# Patient Record
Sex: Female | Born: 1965
Health system: Southern US, Community
[De-identification: ages and names within clinical notes are randomized; demographics above are authoritative.]

## PROBLEM LIST (undated history)

## (undated) DIAGNOSIS — E785 Hyperlipidemia, unspecified: Secondary | ICD-10-CM

## (undated) DIAGNOSIS — E119 Type 2 diabetes mellitus without complications: Secondary | ICD-10-CM

## (undated) DIAGNOSIS — D649 Anemia, unspecified: Secondary | ICD-10-CM

## (undated) DIAGNOSIS — G35 Multiple sclerosis: Secondary | ICD-10-CM

## (undated) DIAGNOSIS — A6000 Herpesviral infection of urogenital system, unspecified: Secondary | ICD-10-CM

## (undated) DIAGNOSIS — H469 Unspecified optic neuritis: Secondary | ICD-10-CM

## (undated) DIAGNOSIS — M1990 Unspecified osteoarthritis, unspecified site: Secondary | ICD-10-CM

## (undated) DIAGNOSIS — I10 Essential (primary) hypertension: Secondary | ICD-10-CM

## (undated) HISTORY — PX: OTHER SURGICAL HISTORY: SHX169

## (undated) HISTORY — PX: BREAST BIOPSY: SHX20

## (undated) HISTORY — PX: ENDOMETRIAL ABLATION: SHX621

## (undated) HISTORY — PX: LAPAROSCOPIC TUBAL LIGATION: SUR803

---

## 1999-09-30 ENCOUNTER — Ambulatory Visit (HOSPITAL_COMMUNITY): Admission: RE | Admit: 1999-09-30 | Discharge: 1999-09-30 | Payer: Self-pay | Admitting: Obstetrics & Gynecology

## 1999-09-30 ENCOUNTER — Encounter: Payer: Self-pay | Admitting: Obstetrics & Gynecology

## 1999-12-27 ENCOUNTER — Encounter: Payer: Self-pay | Admitting: Endocrinology

## 1999-12-27 ENCOUNTER — Ambulatory Visit (HOSPITAL_COMMUNITY): Admission: RE | Admit: 1999-12-27 | Discharge: 1999-12-27 | Payer: Self-pay | Admitting: Endocrinology

## 2000-01-18 ENCOUNTER — Encounter: Payer: Self-pay | Admitting: Neurology

## 2000-01-18 ENCOUNTER — Ambulatory Visit (HOSPITAL_COMMUNITY): Admission: RE | Admit: 2000-01-18 | Discharge: 2000-01-18 | Payer: Self-pay | Admitting: Neurology

## 2000-06-11 ENCOUNTER — Other Ambulatory Visit: Admission: RE | Admit: 2000-06-11 | Discharge: 2000-06-11 | Payer: Self-pay | Admitting: Obstetrics & Gynecology

## 2000-12-27 ENCOUNTER — Inpatient Hospital Stay (HOSPITAL_COMMUNITY): Admission: AD | Admit: 2000-12-27 | Discharge: 2000-12-27 | Payer: Self-pay | Admitting: Obstetrics & Gynecology

## 2000-12-30 ENCOUNTER — Inpatient Hospital Stay (HOSPITAL_COMMUNITY): Admission: AD | Admit: 2000-12-30 | Discharge: 2001-01-01 | Payer: Self-pay | Admitting: Obstetrics & Gynecology

## 2001-01-02 ENCOUNTER — Encounter: Admission: RE | Admit: 2001-01-02 | Discharge: 2001-02-01 | Payer: Self-pay | Admitting: Obstetrics & Gynecology

## 2001-03-04 ENCOUNTER — Encounter: Admission: RE | Admit: 2001-03-04 | Discharge: 2001-03-12 | Payer: Self-pay | Admitting: Obstetrics & Gynecology

## 2002-01-03 ENCOUNTER — Other Ambulatory Visit: Admission: RE | Admit: 2002-01-03 | Discharge: 2002-01-03 | Payer: Self-pay | Admitting: Obstetrics & Gynecology

## 2003-04-30 ENCOUNTER — Other Ambulatory Visit: Admission: RE | Admit: 2003-04-30 | Discharge: 2003-04-30 | Payer: Self-pay | Admitting: Obstetrics & Gynecology

## 2004-06-01 ENCOUNTER — Other Ambulatory Visit: Admission: RE | Admit: 2004-06-01 | Discharge: 2004-06-01 | Payer: Self-pay | Admitting: Obstetrics & Gynecology

## 2006-04-18 ENCOUNTER — Ambulatory Visit: Payer: Self-pay | Admitting: Internal Medicine

## 2007-05-02 ENCOUNTER — Ambulatory Visit: Payer: Self-pay | Admitting: Obstetrics and Gynecology

## 2008-07-02 ENCOUNTER — Ambulatory Visit: Payer: Self-pay | Admitting: Obstetrics and Gynecology

## 2009-08-04 ENCOUNTER — Ambulatory Visit: Payer: Self-pay | Admitting: Obstetrics and Gynecology

## 2009-08-12 ENCOUNTER — Ambulatory Visit: Payer: Self-pay | Admitting: Obstetrics and Gynecology

## 2010-09-01 ENCOUNTER — Ambulatory Visit: Payer: Self-pay | Admitting: Obstetrics and Gynecology

## 2010-09-26 ENCOUNTER — Ambulatory Visit: Payer: Self-pay | Admitting: Obstetrics and Gynecology

## 2010-10-30 DIAGNOSIS — D259 Leiomyoma of uterus, unspecified: Secondary | ICD-10-CM | POA: Insufficient documentation

## 2011-09-04 ENCOUNTER — Ambulatory Visit: Payer: Self-pay | Admitting: Obstetrics and Gynecology

## 2012-04-09 DIAGNOSIS — G35 Multiple sclerosis: Secondary | ICD-10-CM | POA: Insufficient documentation

## 2012-09-04 ENCOUNTER — Ambulatory Visit: Payer: Self-pay | Admitting: Obstetrics and Gynecology

## 2013-06-07 DIAGNOSIS — E7800 Pure hypercholesterolemia, unspecified: Secondary | ICD-10-CM | POA: Insufficient documentation

## 2013-09-09 ENCOUNTER — Ambulatory Visit: Payer: Self-pay | Admitting: Obstetrics and Gynecology

## 2013-09-09 DIAGNOSIS — M25559 Pain in unspecified hip: Secondary | ICD-10-CM | POA: Insufficient documentation

## 2013-09-23 ENCOUNTER — Ambulatory Visit: Payer: Self-pay | Admitting: Obstetrics and Gynecology

## 2014-04-10 DIAGNOSIS — R5383 Other fatigue: Secondary | ICD-10-CM | POA: Insufficient documentation

## 2014-04-10 DIAGNOSIS — R634 Abnormal weight loss: Secondary | ICD-10-CM | POA: Insufficient documentation

## 2014-09-08 DIAGNOSIS — F4329 Adjustment disorder with other symptoms: Secondary | ICD-10-CM | POA: Insufficient documentation

## 2014-09-19 ENCOUNTER — Emergency Department: Payer: Self-pay | Admitting: Emergency Medicine

## 2014-09-19 LAB — CBC WITH DIFFERENTIAL/PLATELET
Basophil #: 0.1 10*3/uL (ref 0.0–0.1)
Basophil %: 0.7 %
Eosinophil #: 0 10*3/uL (ref 0.0–0.7)
Eosinophil %: 0.1 %
HCT: 27.9 % — ABNORMAL LOW (ref 35.0–47.0)
HGB: 8.1 g/dL — ABNORMAL LOW (ref 12.0–16.0)
Lymphocyte #: 1 10*3/uL (ref 1.0–3.6)
Lymphocyte %: 5.9 %
MCH: 18.2 pg — ABNORMAL LOW (ref 26.0–34.0)
MCHC: 29.2 g/dL — ABNORMAL LOW (ref 32.0–36.0)
MCV: 63 fL — ABNORMAL LOW (ref 80–100)
Monocyte #: 0.8 x10 3/mm (ref 0.2–0.9)
Monocyte %: 4.6 %
Neutrophil #: 14.9 10*3/uL — ABNORMAL HIGH (ref 1.4–6.5)
Neutrophil %: 88.7 %
Platelet: 367 10*3/uL (ref 150–440)
RBC: 4.47 10*6/uL (ref 3.80–5.20)
RDW: 20.4 % — ABNORMAL HIGH (ref 11.5–14.5)
WBC: 16.8 10*3/uL — ABNORMAL HIGH (ref 3.6–11.0)

## 2014-09-19 LAB — COMPREHENSIVE METABOLIC PANEL
Albumin: 3.6 g/dL (ref 3.4–5.0)
Alkaline Phosphatase: 61 U/L
Anion Gap: 10 (ref 7–16)
BUN: 10 mg/dL (ref 7–18)
Bilirubin,Total: 0.5 mg/dL (ref 0.2–1.0)
Calcium, Total: 8.7 mg/dL (ref 8.5–10.1)
Chloride: 102 mmol/L (ref 98–107)
Co2: 23 mmol/L (ref 21–32)
Creatinine: 0.74 mg/dL (ref 0.60–1.30)
EGFR (African American): >60
EGFR (Non-African Amer.): >60
Glucose: 267 mg/dL — ABNORMAL HIGH (ref 65–99)
Osmolality: 279 (ref 275–301)
Potassium: 4.5 mmol/L (ref 3.5–5.1)
SGOT(AST): 19 U/L (ref 15–37)
SGPT (ALT): 12 U/L — ABNORMAL LOW
Sodium: 135 mmol/L — ABNORMAL LOW (ref 136–145)
Total Protein: 6.9 g/dL (ref 6.4–8.2)

## 2014-10-27 ENCOUNTER — Ambulatory Visit: Payer: Self-pay | Admitting: Obstetrics and Gynecology

## 2015-01-14 DIAGNOSIS — I152 Hypertension secondary to endocrine disorders: Secondary | ICD-10-CM | POA: Insufficient documentation

## 2015-01-14 DIAGNOSIS — E1159 Type 2 diabetes mellitus with other circulatory complications: Secondary | ICD-10-CM | POA: Insufficient documentation

## 2015-01-14 DIAGNOSIS — D500 Iron deficiency anemia secondary to blood loss (chronic): Secondary | ICD-10-CM | POA: Insufficient documentation

## 2015-12-02 DIAGNOSIS — F17200 Nicotine dependence, unspecified, uncomplicated: Secondary | ICD-10-CM | POA: Insufficient documentation

## 2016-08-18 ENCOUNTER — Encounter: Payer: Self-pay | Admitting: *Deleted

## 2016-08-18 ENCOUNTER — Emergency Department
Admission: EM | Admit: 2016-08-18 | Discharge: 2016-08-18 | Disposition: A | Discharge status: Home / Self Care | Payer: Self-pay | Attending: Emergency Medicine | Admitting: Emergency Medicine

## 2016-08-18 DIAGNOSIS — I10 Essential (primary) hypertension: Secondary | ICD-10-CM | POA: Insufficient documentation

## 2016-08-18 DIAGNOSIS — N939 Abnormal uterine and vaginal bleeding, unspecified: Secondary | ICD-10-CM

## 2016-08-18 DIAGNOSIS — F17210 Nicotine dependence, cigarettes, uncomplicated: Secondary | ICD-10-CM | POA: Insufficient documentation

## 2016-08-18 DIAGNOSIS — E119 Type 2 diabetes mellitus without complications: Secondary | ICD-10-CM | POA: Insufficient documentation

## 2016-08-18 HISTORY — DX: Type 2 diabetes mellitus without complications: E11.9

## 2016-08-18 HISTORY — DX: Essential (primary) hypertension: I10

## 2016-08-18 HISTORY — DX: Multiple sclerosis: G35

## 2016-08-18 LAB — CBC WITH DIFFERENTIAL/PLATELET
Basophils Absolute: 0.1 10*3/uL (ref 0–0.1)
Basophils Relative: 1 %
Eosinophils Absolute: 0 10*3/uL (ref 0–0.7)
Eosinophils Relative: 0 %
HCT: 33.1 % — ABNORMAL LOW (ref 35.0–47.0)
HEMOGLOBIN: 10.8 g/dL — AB (ref 12.0–16.0)
LYMPHS ABS: 1 10*3/uL (ref 1.0–3.6)
LYMPHS PCT: 15 %
MCH: 24.2 pg — AB (ref 26.0–34.0)
MCHC: 32.5 g/dL (ref 32.0–36.0)
MCV: 74.5 fL — AB (ref 80.0–100.0)
Monocytes Absolute: 0.6 10*3/uL (ref 0.2–0.9)
Monocytes Relative: 9 %
NEUTROS PCT: 75 %
Neutro Abs: 5.1 10*3/uL (ref 1.4–6.5)
Platelets: 305 10*3/uL (ref 150–440)
RBC: 4.45 MIL/uL (ref 3.80–5.20)
RDW: 18.2 % — ABNORMAL HIGH (ref 11.5–14.5)
WBC: 6.7 10*3/uL (ref 3.6–11.0)

## 2016-08-18 NOTE — ED Triage Notes (Signed)
Pt reports heavy vaginal bleeding with clots for 2 months, pt reports having no insurance and is unable to see OB/GYN

## 2016-08-18 NOTE — ED Notes (Signed)
Patient denies ABD/Pelvic Pain, Chest Pain, ShOB, N/V. Patient arrives to [facility] per the request of her PCP for evaluation of her "Blood Level".

## 2016-08-18 NOTE — Discharge Instructions (Signed)
Please follow up closely with obstetrics and gynecology or your primary doctor.  Follow-up with OBGYN (your doctor in [place] or our physician team).  Return to the emergency room if your bleeding worsens, you become weak and dizzy or lightheaded, you have an episode of passing out, develop severe bleeding such as more than 1 soaked pad per hour for more than 3 straight hours, develop abdominal or pelvic pain, fevers chills or other new concerns arise.

## 2016-08-18 NOTE — ED Provider Notes (Signed)
[facility] Emergency Department Provider Note   ____________________________________________   First MD Initiated Contact with Patient 08/18/16 [phone]     (approximate)  I have reviewed the triage vital signs and the nursing notes.   HISTORY  Chief Complaint Vaginal Bleeding    HPI Meghan Callahan is a 50 y.o. female here for evaluation of vaginal bleeding  Patient reports she has not any pain. Off-and-on for the last 3 months she's had intermittent vaginal bleeding, we'll bleed for a few days with clots, and then improve on its own. She is not taking blood thinners  No pain fevers or chills. Denies shortness of breath lightheadedness weakness or chest pain  She's been experiencing bleeding that goes through about 3 tampons a day for the last 2-3 days. This morning when she got up she had a large clot that passed, she is called her doctor but she is not able to follow up with Kendell Bane right now due to insurance problems.  She's had ultrasounds before and reports that she's been told she has abnormal uterine bleeding, that she has fibroids, she's had a previous endometrial ablation  Past Medical History:  Diagnosis Date  . Diabetes mellitus without complication (HCC)   . Hypertension   . MS (multiple sclerosis) (HCC)     There are no active problems to display for this patient.   No past surgical history on file.  Prior to Admission medications   Not on File  Ibuprofen as needed Uses insulin  Allergies Review of patient's allergies indicates no known allergies.  No family history on file.  Social History Social History  Substance Use Topics  . Smoking status: Current Every Day Smoker    Packs/day: 1.00    Types: Cigarettes  . Smokeless tobacco: Not on file  . Alcohol use No    Review of Systems Constitutional: No fever/chills Eyes: No visual changes. ENT: No sore throat. Cardiovascular: Denies chest  pain. Respiratory: Denies shortness of breath. Gastrointestinal: No abdominal pain.  No nausea, no vomiting.  No diarrhea.  No constipation. Genitourinary: Negative for dysuria. Musculoskeletal: Negative for back pain. Skin: Negative for rash. Neurological: Negative for headaches, focal weakness or numbness.  10-point ROS otherwise negative.  ____________________________________________   PHYSICAL EXAM:  VITAL SIGNS: ED Triage Vitals  Enc Vitals Group     BP 08/18/16 0825 126/84     Pulse Rate 08/18/16 0825 (!) 105     Resp 08/18/16 0825 18     Temp 08/18/16 0825 97.9 F (36.6 C)     Temp Source 08/18/16 0825 Oral     SpO2 08/18/16 0825 100 %     Weight 08/18/16 0815 138 lb (62.6 kg)     Height 08/18/16 0815 5\' 9"  (1.753 m)     Head Circumference --      Peak Flow --      Pain Score --      Pain Loc --      Pain Edu? --      Excl. in GC? --    Constitutional: Alert and oriented. Well appearing and in no acute distress. Eyes: Conjunctivae are normal. PERRL. EOMI. Head: Atraumatic. Nose: No congestion/rhinnorhea. Mouth/Throat: Mucous membranes are moist.  Oropharynx non-erythematous. Neck: No stridor.   Cardiovascular: Normal rate, regular rhythm. Grossly normal heart sounds.  Good peripheral circulation. Respiratory: Normal respiratory effort.  No retractions. Lungs CTAB. Gastrointestinal: Soft and nontender. No distention.  Musculoskeletal: No lower extremity tenderness nor edema.  Neurologic:  Normal speech and language. No gross focal neurologic deficits are appreciated.  Skin:  Skin is warm, dry and intact. No rash noted. Psychiatric: Mood and affect are normal. Speech and behavior are normal.  ____________________________________________   LABS (all labs ordered are listed, but only abnormal results are displayed)  Labs Reviewed  CBC WITH DIFFERENTIAL/PLATELET - Abnormal; Notable for the following:       Result Value   Hemoglobin 10.8 (*)    HCT 33.1 (*)     MCV 74.5 (*)    MCH 24.2 (*)    RDW 18.2 (*)    All other components within normal limits   ____________________________________________  EKG   ____________________________________________  RADIOLOGY   ____________________________________________   PROCEDURES  Procedure(s) performed: None  Procedures  Critical Care performed: No  ____________________________________________   INITIAL IMPRESSION / ASSESSMENT AND PLAN / ED COURSE  Pertinent labs & imaging results that were available during my care of the patient were reviewed by me and considered in my medical decision making (see chart for details).  Patient has for evaluation of vaginal bleeding. Hemodynamically stable, her heart rate is slightly tachycardic when she speaking and looks just a little anxious, but comes down normally thereafter when spoken to. Denies any symptoms of acute anemia. Currently not having significant ongoing bleeding, but reports she passed a large clot after waking up this morning. She is on extensive history of this, reports previous ultrasound evaluations for same.  Her hemoglobin today is stable, review of [facility] chart records indicate this is her baseline. ----------------------------------------- 9:26 AM on 08/18/2016 -----------------------------------------   Discussed with the patient, labs stable, she is awake alert ambulating without distress in the ER. Recommended she can follow-up with our OB/GYN doctor, patient reports that she will be [facility] to see if she can obtain follow-up there first. I did discuss careful return precautions as well as return precautions and signs of increased bleeding and low blood count the patient. She is agreeable with plan for discharge.  Clinical Course     ____________________________________________   FINAL CLINICAL IMPRESSION(S) / ED DIAGNOSES  Final diagnoses:  Vaginal bleeding  Abnormal uterine bleeding      NEW  MEDICATIONS STARTED DURING THIS VISIT:  New Prescriptions   No medications on file     Note:  This document was prepared using Dragon voice recognition software and may include unintentional dictation errors.     Sharyn CreamerMark Anu Stagner, MD 08/18/16 [phone]

## 2017-02-13 DIAGNOSIS — E559 Vitamin D deficiency, unspecified: Secondary | ICD-10-CM | POA: Insufficient documentation

## 2017-02-13 DIAGNOSIS — Z5181 Encounter for therapeutic drug level monitoring: Secondary | ICD-10-CM | POA: Insufficient documentation

## 2017-10-01 DIAGNOSIS — E109 Type 1 diabetes mellitus without complications: Secondary | ICD-10-CM | POA: Diagnosis not present

## 2017-10-01 DIAGNOSIS — I10 Essential (primary) hypertension: Secondary | ICD-10-CM | POA: Diagnosis not present

## 2017-11-05 DIAGNOSIS — Z01419 Encounter for gynecological examination (general) (routine) without abnormal findings: Secondary | ICD-10-CM | POA: Diagnosis not present

## 2017-11-05 DIAGNOSIS — Z6822 Body mass index (BMI) 22.0-22.9, adult: Secondary | ICD-10-CM | POA: Diagnosis not present

## 2017-11-19 DIAGNOSIS — Z6822 Body mass index (BMI) 22.0-22.9, adult: Secondary | ICD-10-CM | POA: Diagnosis not present

## 2017-12-04 DIAGNOSIS — Z5181 Encounter for therapeutic drug level monitoring: Secondary | ICD-10-CM | POA: Diagnosis not present

## 2017-12-04 DIAGNOSIS — G35 Multiple sclerosis: Secondary | ICD-10-CM | POA: Diagnosis not present

## 2017-12-25 DIAGNOSIS — E109 Type 1 diabetes mellitus without complications: Secondary | ICD-10-CM | POA: Diagnosis not present

## 2017-12-25 DIAGNOSIS — I10 Essential (primary) hypertension: Secondary | ICD-10-CM | POA: Diagnosis not present

## 2017-12-28 ENCOUNTER — Other Ambulatory Visit: Payer: Self-pay | Admitting: Internal Medicine

## 2017-12-28 DIAGNOSIS — I10 Essential (primary) hypertension: Secondary | ICD-10-CM | POA: Diagnosis not present

## 2017-12-28 DIAGNOSIS — E109 Type 1 diabetes mellitus without complications: Secondary | ICD-10-CM | POA: Diagnosis not present

## 2017-12-28 DIAGNOSIS — G35 Multiple sclerosis: Secondary | ICD-10-CM | POA: Diagnosis not present

## 2017-12-28 DIAGNOSIS — Z1231 Encounter for screening mammogram for malignant neoplasm of breast: Secondary | ICD-10-CM

## 2017-12-29 DIAGNOSIS — G35 Multiple sclerosis: Secondary | ICD-10-CM | POA: Diagnosis not present

## 2018-01-31 ENCOUNTER — Other Ambulatory Visit: Payer: Self-pay

## 2018-01-31 ENCOUNTER — Emergency Department: Payer: 59

## 2018-01-31 ENCOUNTER — Encounter: Payer: Self-pay | Admitting: Emergency Medicine

## 2018-01-31 ENCOUNTER — Emergency Department
Admission: EM | Admit: 2018-01-31 | Discharge: 2018-01-31 | Disposition: A | Discharge status: Home / Self Care | Payer: 59 | Attending: Emergency Medicine | Admitting: Emergency Medicine

## 2018-01-31 DIAGNOSIS — Z87891 Personal history of nicotine dependence: Secondary | ICD-10-CM | POA: Insufficient documentation

## 2018-01-31 DIAGNOSIS — R202 Paresthesia of skin: Secondary | ICD-10-CM | POA: Insufficient documentation

## 2018-01-31 DIAGNOSIS — I10 Essential (primary) hypertension: Secondary | ICD-10-CM | POA: Insufficient documentation

## 2018-01-31 DIAGNOSIS — G35 Multiple sclerosis: Secondary | ICD-10-CM | POA: Diagnosis not present

## 2018-01-31 DIAGNOSIS — R030 Elevated blood-pressure reading, without diagnosis of hypertension: Secondary | ICD-10-CM | POA: Diagnosis not present

## 2018-01-31 DIAGNOSIS — E119 Type 2 diabetes mellitus without complications: Secondary | ICD-10-CM | POA: Diagnosis not present

## 2018-01-31 DIAGNOSIS — R51 Headache: Secondary | ICD-10-CM | POA: Diagnosis not present

## 2018-01-31 LAB — BASIC METABOLIC PANEL
Anion gap: 9 (ref 5–15)
BUN: 9 mg/dL (ref 6–20)
CHLORIDE: 104 mmol/L (ref 101–111)
CO2: 27 mmol/L (ref 22–32)
CREATININE: 0.54 mg/dL (ref 0.44–1.00)
Calcium: 9.7 mg/dL (ref 8.9–10.3)
GFR calc Af Amer: >60 mL/min (ref >60)
GFR calc non Af Amer: >60 mL/min (ref >60)
GLUCOSE: 84 mg/dL (ref 65–99)
Potassium: 3.8 mmol/L (ref 3.5–5.1)
SODIUM: 140 mmol/L (ref 135–145)

## 2018-01-31 LAB — CBC
HCT: 40.6 % (ref 35.0–47.0)
Hemoglobin: 13.1 g/dL (ref 12.0–16.0)
MCH: 26.6 pg (ref 26.0–34.0)
MCHC: 32.2 g/dL (ref 32.0–36.0)
MCV: 82.5 fL (ref 80.0–100.0)
PLATELETS: 392 10*3/uL (ref 150–440)
RBC: 4.92 MIL/uL (ref 3.80–5.20)
RDW: 14.8 % — AB (ref 11.5–14.5)
WBC: 6 10*3/uL (ref 3.6–11.0)

## 2018-01-31 LAB — TROPONIN I: Troponin I: <0.03 ng/mL (ref <0.03)

## 2018-01-31 LAB — PREGNANCY, URINE: Preg Test, Ur: NEGATIVE

## 2018-01-31 MED ORDER — IOHEXOL 350 MG/ML SOLN
75.0000 mL | Freq: Once | INTRAVENOUS | Status: AC | PRN
  Administered 2018-01-31: 75 mL via INTRAVENOUS

## 2018-01-31 MED ORDER — HYDROCHLOROTHIAZIDE 25 MG PO TABS
25.0000 mg | ORAL_TABLET | Freq: Once | ORAL | Status: AC
Start: 2018-01-31 — End: 2018-01-31
  Administered 2018-01-31: 25 mg via ORAL
  Filled 2018-01-31: qty 1

## 2018-01-31 MED ORDER — HYDROCHLOROTHIAZIDE 25 MG PO TABS: 25.0000 mg | ORAL_TABLET | Freq: Every day | ORAL | 1 refills | Status: AC

## 2018-01-31 NOTE — ED Triage Notes (Signed)
First Nurse Note:  Arrives from [facility] and was seen by Debbra Riding for c/o HTN x 2-3 days.  Patient has history of HTN and is currently taking Valsartan.  B running 180-200/ 90-100.  Patient also c/o headache and tingling to left arm.    Patient AAOx3.  Skin warm and dry.  No SOB/DOE.

## 2018-01-31 NOTE — ED Triage Notes (Signed)
Pt presents with hypertension. States multiple medications that she was taking were recalled. She was on valsartan for over 5 years and felt it was doing well, but it was recalled and she was prescribed losartan, which she states was also recalled. She has tried several more, which she states were also recalled within 10 days of starting them. She began taking her old valsartan prescription today. She states she is suffering from anxiety and that her PA suggested she have "a scan" since her bp has been so high for so long. She states she recently had an MRI (for MS).   Pt's symptoms include left hand tingling x 2 months, randomly; numbness and "jolt of discomfort" in her upper back/shoulder for 2 months.   Pt alert & oriented with NAD noted.

## 2018-01-31 NOTE — ED Provider Notes (Signed)
[facility] Emergency Department Provider Note  ____________________________________________  Time seen: Approximately 8:21 PM  I have reviewed the triage vital signs and the nursing notes.   HISTORY  Chief Complaint Hypertension   HPI Meghan Callahan is a 52 y.o. female with a history of multiple sclerosis, hypertension, diabetes who presents for evaluation of elevated blood pressure.  Patient reports that her blood pressure used to be very well controlled on valsartan.  Valsartan was recalled due to carcinogenic contaminant.  Since then patient has been struggling to get her blood pressure under control. Has not been on meds for 3 days. Her primary care doctor has tried different ARBs with no success.  No other medications have been tried.  Today she saw the PA at the primary care doctor who recommended that she came to the emergency room since her BP had been elevated for so long to make sure everything was ok. Patient denies headache, chest pain, dizziness, shortness of breath.  She does endorse paresthesias of her left upper extremity.  She reports that these have been intermittent for several months.  She usually sleeps on her right side and wakes up in the morning with paresthesias on her left arm.  Throughout the course of the day these resolved.  Patient has had paresthesias in her left arm that have been constant associated with mild numbness since yesterday evening.  She denies any weakness, weakness or numbness of her left lower extremity, no facial droop, no slurred speech, no history of stroke.   Past Medical History:  Diagnosis Date  . Diabetes mellitus without complication (HCC)   . Hypertension   . MS (multiple sclerosis) (HCC)     There are no active problems to display for this patient.   History reviewed. No pertinent surgical history.  Prior to Admission medications   Medication Sig Start Date End Date Taking? Authorizing  Provider  hydrochlorothiazide (HYDRODIURIL) 25 MG tablet Take 1 tablet (25 mg total) by mouth daily. 01/31/18   Nita Sickle, MD    Allergies Patient has no known allergies.  History reviewed. No pertinent family history.  Social History Social History   Tobacco Use  . Smoking status: Former Smoker    Packs/day: 1.00    Types: Cigarettes    Last attempt to quit: 09/29/2017    Years since quitting: 0.3  . Smokeless tobacco: Never Used  Substance Use Topics  . Alcohol use: Never    Frequency: Never  . Drug use: Never    Review of Systems  Constitutional: Negative for fever. Eyes: Negative for visual changes. ENT: Negative for sore throat. Neck: No neck pain  Cardiovascular: Negative for chest pain. Respiratory: Negative for shortness of breath. Gastrointestinal: Negative for abdominal pain, vomiting or diarrhea. Genitourinary: Negative for dysuria. Musculoskeletal: Negative for back pain. Skin: Negative for rash. Neurological: Negative for headaches, weakness or numbness. + LUE paresthesias Psych: No SI or HI  ____________________________________________   PHYSICAL EXAM:  VITAL SIGNS: ED Triage Vitals  Enc Vitals Group     BP 01/31/18 1733 (!) 208/109     Pulse Rate 01/31/18 1733 (!) 57     Resp 01/31/18 1733 16     Temp 01/31/18 1733 98.1 F (36.7 C)     Temp Source 01/31/18 1733 Oral     SpO2 01/31/18 1733 100 %     Weight 01/31/18 1735 152 lb (68.9 kg)     Height 01/31/18 1735 5\' 9"  (1.753 m)  Head Circumference --      Peak Flow --      Pain Score 01/31/18 1734 7     Pain Loc --      Pain Edu? --      Excl. in GC? --     Constitutional: Alert and oriented. Well appearing and in no apparent distress. HEENT:      Head: Normocephalic and atraumatic.         Eyes: Conjunctivae are normal. Sclera is non-icteric.       Mouth/Throat: Mucous membranes are moist.       Neck: Supple with no signs of meningismus. Cardiovascular: Regular rate and  rhythm. No murmurs, gallops, or rubs. 2+ symmetrical distal pulses are present in all extremities. No JVD. Respiratory: Normal respiratory effort. Lungs are clear to auscultation bilaterally. No wheezes, crackles, or rhonchi.  Gastrointestinal: Soft, non tender, and non distended with positive bowel sounds. No rebound or guarding. Musculoskeletal: Nontender with normal range of motion in all extremities. No edema, cyanosis, or erythema of extremities. Neurologic: Normal speech and language. A & O x3, PERRL, EOMI, no nystagmus, CN II-XII intact, motor testing reveals good tone and bulk throughout. There is no evidence of pronator drift or dysmetria. Muscle strength is 5/5 throughout.  Sensory examination is intact. Gait is normal. Skin: Skin is warm, dry and intact. No rash noted. Psychiatric: Mood and affect are normal. Speech and behavior are normal.  ____________________________________________   LABS (all labs ordered are listed, but only abnormal results are displayed)  Labs Reviewed  CBC - Abnormal; Notable for the following components:      Result Value   RDW 14.8 (*)    All other components within normal limits  BASIC METABOLIC PANEL  TROPONIN I  PREGNANCY, URINE   ____________________________________________  EKG  ED ECG REPORT I, Nita Sickle, the attending physician, personally viewed and interpreted this ECG.  Sinus tachycardia, rate of 106, normal intervals, normal axis, no ST elevations or depressions.  No prior for comparison. ____________________________________________  RADIOLOGY  I have personally reviewed the images performed during this visit and I agree with the Radiologist's read.   Interpretation by Radiologist:  Dg Chest 2 View  Result Date: 01/31/2018 CLINICAL DATA:  Hypertension EXAM: CHEST - 2 VIEW COMPARISON:  None. FINDINGS: The heart size and mediastinal contours are within normal limits. Both lungs are clear. Degenerative changes of the spine  with probable endplate changes at the lower thoracic spine. IMPRESSION: No active cardiopulmonary disease. Electronically Signed   By: Jasmine Pang M.D.   On: 01/31/2018 18:25   Ct Head Wo Contrast  Result Date: 01/31/2018 CLINICAL DATA:  Elevated blood pressure headache and tingling to the left arm EXAM: CT HEAD WITHOUT CONTRAST TECHNIQUE: Contiguous axial images were obtained from the base of the skull through the vertex without intravenous contrast. COMPARISON:  MRI report 12/27/1999 FINDINGS: Brain: No acute territorial infarction, hemorrhage or intracranial mass is visualized. Scattered hypodensity within the bilateral white matter. Ventricles are nonenlarged. Vascular: No hyperdense vessels.  Carotid vascular calcification Skull: Normal. Negative for fracture or focal lesion. Sinuses/Orbits: No acute finding. Other: None IMPRESSION: 1. No definite CT evidence for acute intracranial abnormality. 2. Scattered hypodensity within the white matter, may relate to mild small vessel ischemic change or demyelinating disease. Electronically Signed   By: Jasmine Pang M.D.   On: 01/31/2018 18:24   Ct Angio Chest Aorta W And/or Wo Contrast  Result Date: 01/31/2018 CLINICAL DATA:  52 y/o F; elevated  blood pressure with paresthesias and numbness in the left arm. EXAM: CT ANGIOGRAPHY CHEST WITH CONTRAST TECHNIQUE: Multidetector CT imaging of the chest was performed using the standard protocol during bolus administration of intravenous contrast. Multiplanar CT image reconstructions and MIPs were obtained to evaluate the vascular anatomy. CONTRAST:  75mL OMNIPAQUE IOHEXOL 350 MG/ML SOLN COMPARISON:  None. FINDINGS: Cardiovascular: Preferential opacification of the thoracic aorta. No evidence of thoracic aortic aneurysm or dissection. Normal heart size. No pericardial effusion. Mild calcific atherosclerosis of thoracic aorta and severe coronary artery calcific atherosclerosis. Mediastinum/Nodes: No enlarged mediastinal,  hilar, or axillary lymph nodes. Thyroid gland, trachea, and esophagus demonstrate no significant findings. Lungs/Pleura: Lungs are clear. No pleural effusion or pneumothorax. Upper Abdomen: No acute abnormality. Musculoskeletal: No acute fracture. Multilevel discogenic degenerative changes of the spine that is severe in the visual lower cervical spine and thoracic spine greatest at C6-7 and T10-11. Review of the MIP images confirms the above findings. IMPRESSION: 1. No aortic dissection or aneurysm. 2. Mild aortic and severe coronary artery calcific atherosclerosis. 3. Clear lungs. 4. Severe discogenic degenerative changes of the lower cervical and thoracic spine. Electronically Signed   By: Mitzi Hansen M.D.   On: 01/31/2018 19:22     ____________________________________________   PROCEDURES  Procedure(s) performed: None Procedures Critical Care performed:  None ____________________________________________   INITIAL IMPRESSION / ASSESSMENT AND PLAN / ED COURSE  52 y.o. female with a history of multiple sclerosis, hypertension, diabetes who presents for evaluation of elevated blood pressure and L arm paresthesias.  Patient has had uncontrolled high blood pressure now for several weeks.  Not take any medications for 3 days due to several recalls from ARBs.  Patient has had intermittent left arm paresthesias for several months.  She does have a history of MS however due to patient severely elevated blood pressure and now constant paresthesias since last night I sent patient for a CT angiogram to rule out a dissection which was negative. No signs of stroke.  Her EKG shows no ischemic changes.  Labs showed no endorgan damage.  I started patient hydrochlorothiazide with good response of her blood pressure.  She continues to be asymptomatic.  Discussed keeping a blood pressure diary for the next few days and see her primary care doctor Monday.  Discussed return precautions for any signs of  stroke, dissection, or chest pain.      As part of my medical decision making, I reviewed the following data within the electronic MEDICAL RECORD NUMBER Nursing notes reviewed and incorporated, Labs reviewed , EKG interpreted , Radiograph reviewed , Old chart reviewed, Notes from prior ED visits and [place] Controlled Substance Database    Pertinent labs & imaging results that were available during my care of the patient were reviewed by me and considered in my medical decision making (see chart for details).    ____________________________________________   FINAL CLINICAL IMPRESSION(S) / ED DIAGNOSES  Final diagnoses:  Hypertension, unspecified type  Arm paresthesia, left      NEW MEDICATIONS STARTED DURING THIS VISIT:  ED Discharge Orders        Ordered    hydrochlorothiazide (HYDRODIURIL) 25 MG tablet  Daily     01/31/18 2020       Note:  This document was prepared using Dragon voice recognition software and may include unintentional dictation errors.    Nita Sickle, MD 01/31/18 2026

## 2018-02-05 DIAGNOSIS — I10 Essential (primary) hypertension: Secondary | ICD-10-CM | POA: Diagnosis not present

## 2018-02-05 DIAGNOSIS — E109 Type 1 diabetes mellitus without complications: Secondary | ICD-10-CM | POA: Diagnosis not present

## 2018-02-05 DIAGNOSIS — G35 Multiple sclerosis: Secondary | ICD-10-CM | POA: Diagnosis not present

## 2018-03-08 ENCOUNTER — Ambulatory Visit
Admission: RE | Admit: 2018-03-08 | Discharge: 2018-03-08 | Disposition: A | Discharge status: Home / Self Care | Payer: 59 | Source: Ambulatory Visit | Attending: Internal Medicine | Admitting: Internal Medicine

## 2018-03-08 DIAGNOSIS — Z1231 Encounter for screening mammogram for malignant neoplasm of breast: Secondary | ICD-10-CM

## 2018-05-23 DIAGNOSIS — G8929 Other chronic pain: Secondary | ICD-10-CM | POA: Diagnosis not present

## 2018-05-23 DIAGNOSIS — M5441 Lumbago with sciatica, right side: Secondary | ICD-10-CM | POA: Diagnosis not present

## 2018-05-23 DIAGNOSIS — G35 Multiple sclerosis: Secondary | ICD-10-CM | POA: Diagnosis not present

## 2018-05-23 DIAGNOSIS — Z5181 Encounter for therapeutic drug level monitoring: Secondary | ICD-10-CM | POA: Diagnosis not present

## 2018-06-08 DIAGNOSIS — G8929 Other chronic pain: Secondary | ICD-10-CM | POA: Diagnosis not present

## 2018-06-08 DIAGNOSIS — M5441 Lumbago with sciatica, right side: Secondary | ICD-10-CM | POA: Diagnosis not present

## 2018-06-08 DIAGNOSIS — M48061 Spinal stenosis, lumbar region without neurogenic claudication: Secondary | ICD-10-CM | POA: Diagnosis not present

## 2018-06-08 DIAGNOSIS — M4316 Spondylolisthesis, lumbar region: Secondary | ICD-10-CM | POA: Diagnosis not present

## 2018-06-25 DIAGNOSIS — I10 Essential (primary) hypertension: Secondary | ICD-10-CM | POA: Diagnosis not present

## 2018-06-25 DIAGNOSIS — G35 Multiple sclerosis: Secondary | ICD-10-CM | POA: Diagnosis not present

## 2018-06-25 DIAGNOSIS — E109 Type 1 diabetes mellitus without complications: Secondary | ICD-10-CM | POA: Diagnosis not present

## 2018-07-02 DIAGNOSIS — Z0000 Encounter for general adult medical examination without abnormal findings: Secondary | ICD-10-CM | POA: Diagnosis not present

## 2018-07-02 DIAGNOSIS — I10 Essential (primary) hypertension: Secondary | ICD-10-CM | POA: Diagnosis not present

## 2018-07-02 DIAGNOSIS — E109 Type 1 diabetes mellitus without complications: Secondary | ICD-10-CM | POA: Diagnosis not present

## 2018-08-26 DIAGNOSIS — I10 Essential (primary) hypertension: Secondary | ICD-10-CM | POA: Diagnosis not present

## 2018-08-26 DIAGNOSIS — E109 Type 1 diabetes mellitus without complications: Secondary | ICD-10-CM | POA: Diagnosis not present

## 2018-11-26 DIAGNOSIS — Z5181 Encounter for therapeutic drug level monitoring: Secondary | ICD-10-CM | POA: Diagnosis not present

## 2018-11-26 DIAGNOSIS — E559 Vitamin D deficiency, unspecified: Secondary | ICD-10-CM | POA: Diagnosis not present

## 2018-11-26 DIAGNOSIS — G35 Multiple sclerosis: Secondary | ICD-10-CM | POA: Diagnosis not present

## 2018-12-30 DIAGNOSIS — Z794 Long term (current) use of insulin: Secondary | ICD-10-CM | POA: Diagnosis not present

## 2018-12-30 DIAGNOSIS — H468 Other optic neuritis: Secondary | ICD-10-CM | POA: Diagnosis not present

## 2018-12-30 DIAGNOSIS — H2513 Age-related nuclear cataract, bilateral: Secondary | ICD-10-CM | POA: Diagnosis not present

## 2018-12-30 DIAGNOSIS — E119 Type 2 diabetes mellitus without complications: Secondary | ICD-10-CM | POA: Diagnosis not present

## 2019-01-08 ENCOUNTER — Other Ambulatory Visit: Payer: Self-pay | Admitting: Internal Medicine

## 2019-01-08 DIAGNOSIS — Z1231 Encounter for screening mammogram for malignant neoplasm of breast: Secondary | ICD-10-CM

## 2019-01-08 DIAGNOSIS — D500 Iron deficiency anemia secondary to blood loss (chronic): Secondary | ICD-10-CM | POA: Diagnosis not present

## 2019-01-08 DIAGNOSIS — G35 Multiple sclerosis: Secondary | ICD-10-CM | POA: Diagnosis not present

## 2019-01-08 DIAGNOSIS — E109 Type 1 diabetes mellitus without complications: Secondary | ICD-10-CM | POA: Diagnosis not present

## 2019-01-08 DIAGNOSIS — I10 Essential (primary) hypertension: Secondary | ICD-10-CM | POA: Diagnosis not present

## 2019-01-08 DIAGNOSIS — E538 Deficiency of other specified B group vitamins: Secondary | ICD-10-CM | POA: Diagnosis not present

## 2019-01-08 DIAGNOSIS — R5381 Other malaise: Secondary | ICD-10-CM | POA: Diagnosis not present

## 2019-06-13 ENCOUNTER — Ambulatory Visit
Admission: RE | Admit: 2019-06-13 | Discharge: 2019-06-13 | Disposition: A | Discharge status: Home / Self Care | Payer: 59 | Source: Ambulatory Visit | Attending: Internal Medicine | Admitting: Internal Medicine

## 2019-06-13 ENCOUNTER — Other Ambulatory Visit: Payer: Self-pay

## 2019-06-13 DIAGNOSIS — Z1231 Encounter for screening mammogram for malignant neoplasm of breast: Secondary | ICD-10-CM | POA: Diagnosis not present

## 2019-08-19 DIAGNOSIS — E1069 Type 1 diabetes mellitus with other specified complication: Secondary | ICD-10-CM | POA: Insufficient documentation

## 2019-10-27 IMAGING — MG MM DIGITAL SCREENING BILAT W/ CAD
5 series · 5 of 5 positions shown · non-contrast
Comparison: Previous exam(s).

CLINICAL DATA: Screening.

EXAM:
DIGITAL SCREENING BILATERAL MAMMOGRAM WITH CAD

[L MLO]
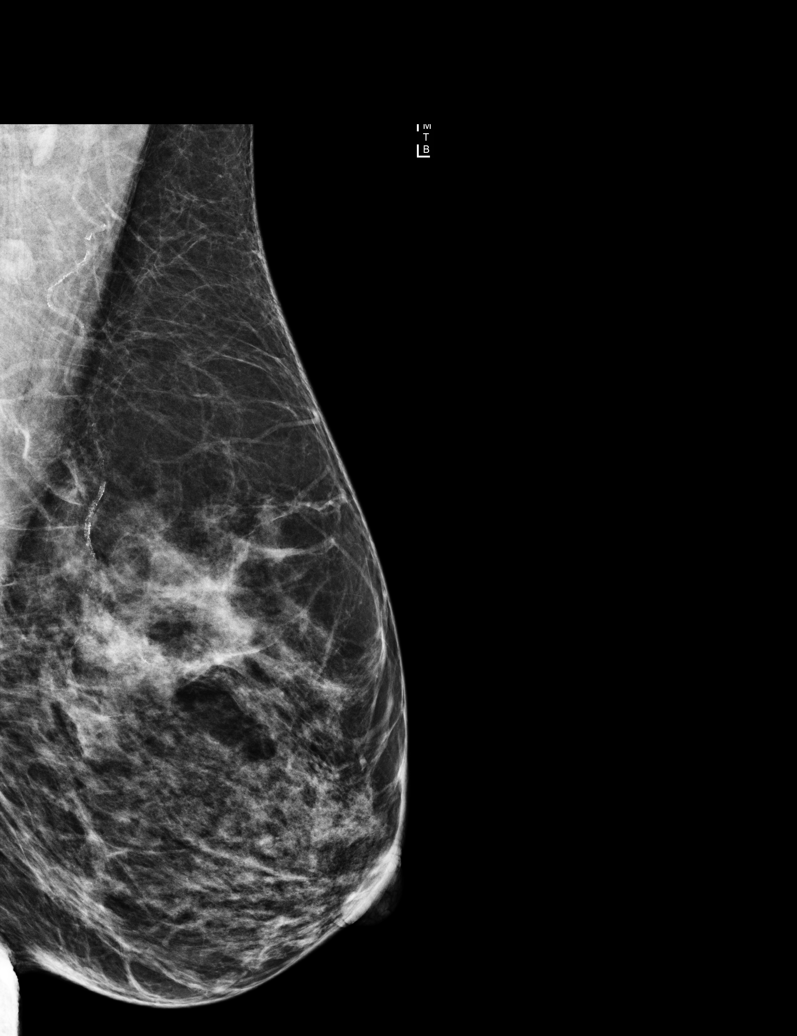

[L CC]
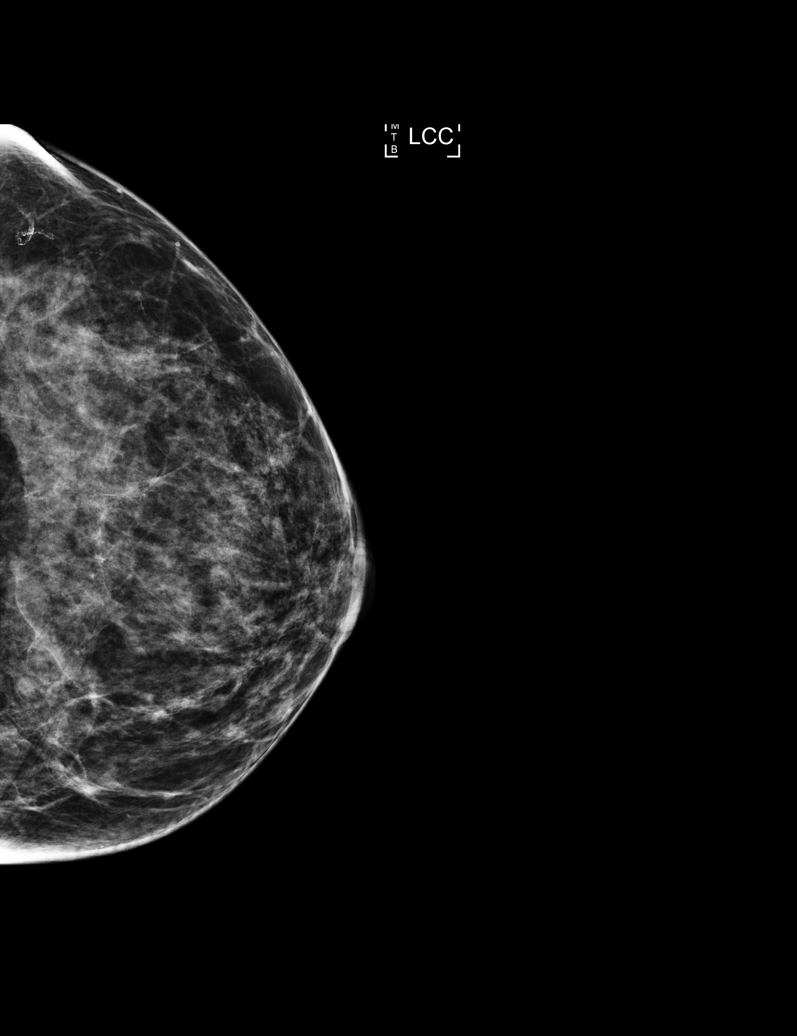

[R MLO (1 of 2)]
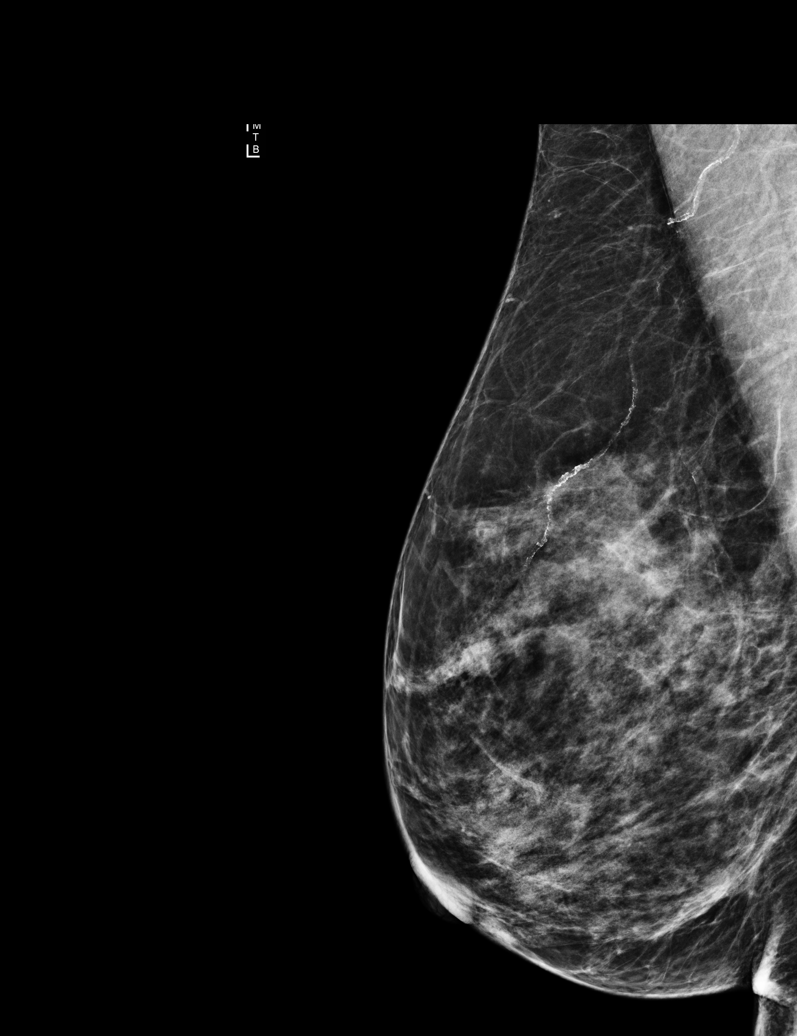

[R CC]
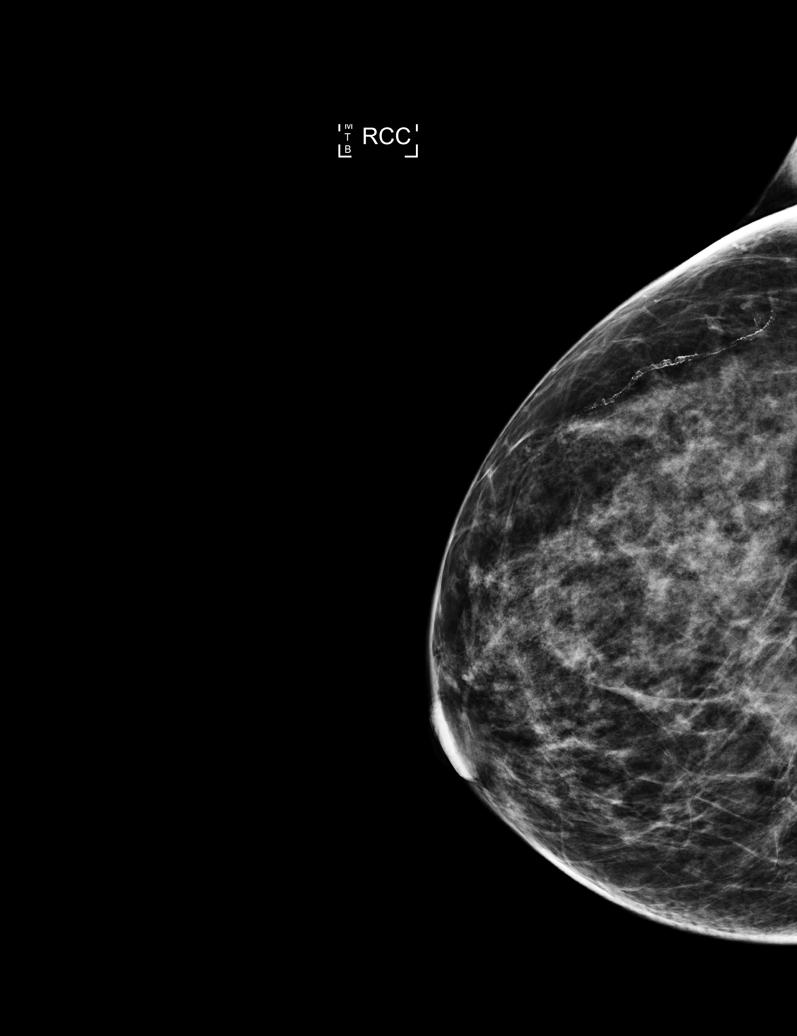

[R MLO (2 of 2)]
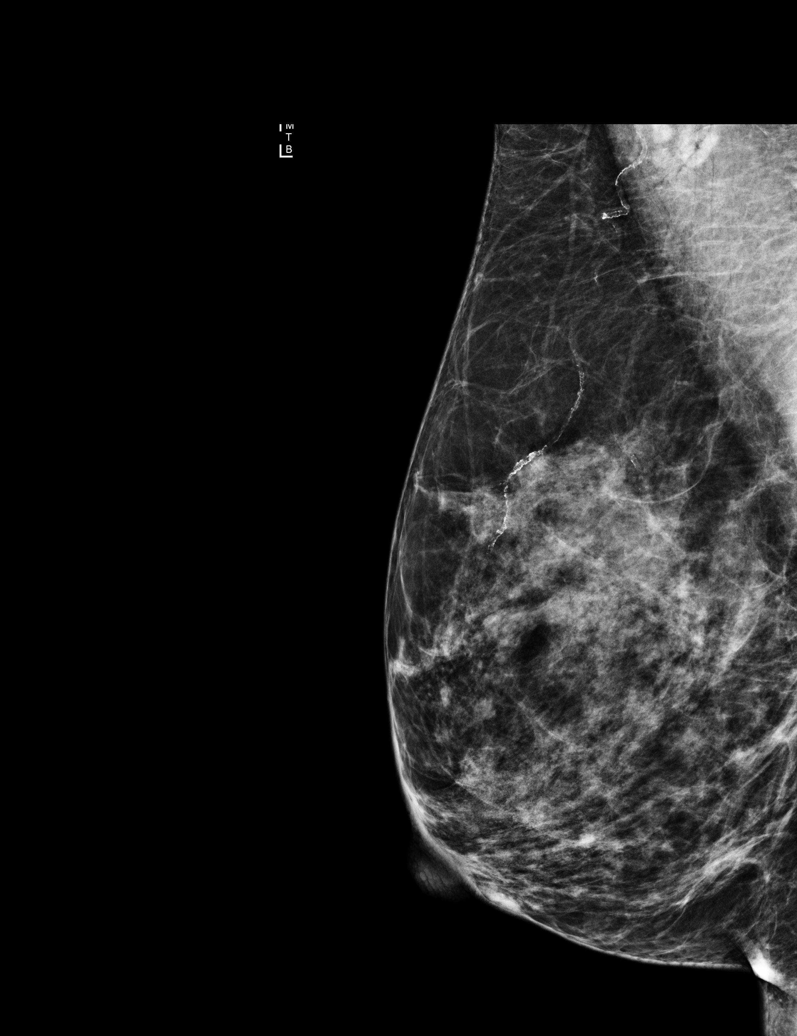

[5 of 5 positions shown; findings below may reference images not displayed]

ACR Breast Density Category c: The breast tissue is heterogeneously
dense, which may obscure small masses.
FINDINGS: There are no findings suspicious for malignancy. Images were
processed with CAD.
IMPRESSION: No mammographic evidence of malignancy. A result letter of this
screening mammogram will be mailed directly to the patient.

RECOMMENDATION:
Screening mammogram in one year. (Code:YJ-[id number])

BI-RADS CATEGORY  1: Negative.

## 2020-05-06 ENCOUNTER — Other Ambulatory Visit: Payer: Self-pay | Admitting: Internal Medicine

## 2020-05-06 DIAGNOSIS — Z1231 Encounter for screening mammogram for malignant neoplasm of breast: Secondary | ICD-10-CM

## 2020-06-14 ENCOUNTER — Other Ambulatory Visit: Payer: Self-pay

## 2020-06-14 ENCOUNTER — Ambulatory Visit
Admission: RE | Admit: 2020-06-14 | Discharge: 2020-06-14 | Disposition: A | Discharge status: Home / Self Care | Payer: 59 | Source: Ambulatory Visit | Attending: Internal Medicine | Admitting: Internal Medicine

## 2020-06-14 DIAGNOSIS — Z1231 Encounter for screening mammogram for malignant neoplasm of breast: Secondary | ICD-10-CM | POA: Diagnosis present

## 2021-01-28 DIAGNOSIS — F3340 Major depressive disorder, recurrent, in remission, unspecified: Secondary | ICD-10-CM | POA: Insufficient documentation

## 2021-06-28 ENCOUNTER — Other Ambulatory Visit: Payer: Self-pay | Admitting: Obstetrics and Gynecology

## 2021-06-28 DIAGNOSIS — Z1231 Encounter for screening mammogram for malignant neoplasm of breast: Secondary | ICD-10-CM

## 2021-07-26 ENCOUNTER — Other Ambulatory Visit: Payer: Self-pay

## 2021-07-26 ENCOUNTER — Ambulatory Visit
Admission: RE | Admit: 2021-07-26 | Discharge: 2021-07-26 | Disposition: A | Discharge status: Home / Self Care | Payer: 59 | Source: Ambulatory Visit | Attending: Obstetrics and Gynecology | Admitting: Obstetrics and Gynecology

## 2021-07-26 DIAGNOSIS — Z1231 Encounter for screening mammogram for malignant neoplasm of breast: Secondary | ICD-10-CM | POA: Insufficient documentation

## 2021-08-03 ENCOUNTER — Encounter: Payer: Self-pay | Admitting: Podiatry

## 2021-08-03 ENCOUNTER — Ambulatory Visit: Payer: 59 | Admitting: Podiatry

## 2021-08-03 ENCOUNTER — Other Ambulatory Visit: Payer: Self-pay

## 2021-08-03 DIAGNOSIS — L84 Corns and callosities: Secondary | ICD-10-CM

## 2021-08-03 DIAGNOSIS — E119 Type 2 diabetes mellitus without complications: Secondary | ICD-10-CM | POA: Diagnosis not present

## 2021-08-03 NOTE — Progress Notes (Signed)
  Subjective:  Patient ID: Meghan Callahan, female    DOB: 1966-01-25,   MRN: [id number]  No chief complaint on file.   55 y.o. female presents for diabetic foot check. Concern for right foot callus. Last seen by endocrinologist Crista Curb two weeks ago. Last A1c was 11.1 . Denies any other pedal complaints. Denies n/v/f/c.   Past Medical History:  Diagnosis Date   Diabetes mellitus without complication (HCC)    Hypertension    MS (multiple sclerosis) (HCC)     Objective:  Physical Exam: Vascular: DP/PT pulses 2/4 bilateral. CFT <3 seconds. Normal hair growth on digits. No edema.  Skin. No lacerations or abrasions bilateral feet. Hyperkeratosis noted sub 3rd metatarsal bilateral.  Musculoskeletal: MMT 5/5 bilateral lower extremities in DF, PF, Inversion and Eversion. Deceased ROM in DF of ankle joint.  Neurological: Sensation intact to light touch.   Assessment:   1. Type 2 diabetes mellitus without complication, unspecified whether long term insulin use (HCC)   2. Callus of foot      Plan:  Patient was evaluated and treated and all questions answered. -Discussed and educated patient on diabetic foot care, especially with  regards to the vascular, neurological and musculoskeletal systems.  -Stressed the importance of good glycemic control and the detriment of not  controlling glucose levels in relation to the foot. -Discussed supportive shoes at all times and checking feet regularly.  -Answered all patient questions -Patient to return  1 year for diabetic care.  -Patient advised to call the office if any problems or questions arise in the meantime.   Louann Sjogren, DPM

## 2022-01-10 DIAGNOSIS — H5789 Other specified disorders of eye and adnexa: Secondary | ICD-10-CM | POA: Insufficient documentation

## 2022-06-21 ENCOUNTER — Other Ambulatory Visit: Payer: Self-pay | Admitting: Obstetrics and Gynecology

## 2022-06-21 DIAGNOSIS — Z1231 Encounter for screening mammogram for malignant neoplasm of breast: Secondary | ICD-10-CM

## 2022-07-28 ENCOUNTER — Ambulatory Visit
Admission: RE | Admit: 2022-07-28 | Discharge: 2022-07-28 | Disposition: A | Discharge status: Home / Self Care | Payer: 59 | Source: Ambulatory Visit | Attending: Obstetrics and Gynecology | Admitting: Obstetrics and Gynecology

## 2022-07-28 DIAGNOSIS — Z1231 Encounter for screening mammogram for malignant neoplasm of breast: Secondary | ICD-10-CM | POA: Diagnosis not present

## 2022-08-01 ENCOUNTER — Other Ambulatory Visit: Payer: Self-pay | Admitting: Obstetrics and Gynecology

## 2022-08-01 DIAGNOSIS — R928 Other abnormal and inconclusive findings on diagnostic imaging of breast: Secondary | ICD-10-CM

## 2022-08-01 DIAGNOSIS — N6489 Other specified disorders of breast: Secondary | ICD-10-CM

## 2022-08-14 ENCOUNTER — Ambulatory Visit
Admission: RE | Admit: 2022-08-14 | Discharge: 2022-08-14 | Disposition: A | Discharge status: Home / Self Care | Payer: 59 | Source: Ambulatory Visit | Attending: Obstetrics and Gynecology | Admitting: Obstetrics and Gynecology

## 2022-08-14 DIAGNOSIS — R928 Other abnormal and inconclusive findings on diagnostic imaging of breast: Secondary | ICD-10-CM | POA: Diagnosis present

## 2022-08-14 DIAGNOSIS — N6489 Other specified disorders of breast: Secondary | ICD-10-CM | POA: Insufficient documentation

## 2022-09-20 LAB — COLOGUARD: COLOGUARD: NEGATIVE

## 2023-01-24 ENCOUNTER — Other Ambulatory Visit: Payer: Self-pay | Admitting: Obstetrics and Gynecology

## 2023-01-24 DIAGNOSIS — N6489 Other specified disorders of breast: Secondary | ICD-10-CM

## 2023-02-19 ENCOUNTER — Ambulatory Visit
Admission: RE | Admit: 2023-02-19 | Discharge: 2023-02-19 | Disposition: A | Discharge status: Home / Self Care | Payer: 59 | Source: Ambulatory Visit | Attending: Obstetrics and Gynecology | Admitting: Obstetrics and Gynecology

## 2023-02-19 DIAGNOSIS — N6489 Other specified disorders of breast: Secondary | ICD-10-CM | POA: Insufficient documentation

## 2023-03-19 DIAGNOSIS — M4807 Spinal stenosis, lumbosacral region: Secondary | ICD-10-CM | POA: Insufficient documentation

## 2023-07-17 ENCOUNTER — Emergency Department
Admission: EM | Admit: 2023-07-17 | Discharge: 2023-07-17 | Disposition: A | Discharge status: Home / Self Care | Payer: 59 | Attending: Emergency Medicine | Admitting: Emergency Medicine

## 2023-07-17 ENCOUNTER — Encounter: Payer: Self-pay | Admitting: Emergency Medicine

## 2023-07-17 ENCOUNTER — Other Ambulatory Visit: Payer: Self-pay

## 2023-07-17 DIAGNOSIS — E119 Type 2 diabetes mellitus without complications: Secondary | ICD-10-CM | POA: Insufficient documentation

## 2023-07-17 DIAGNOSIS — I10 Essential (primary) hypertension: Secondary | ICD-10-CM | POA: Insufficient documentation

## 2023-07-17 DIAGNOSIS — M5441 Lumbago with sciatica, right side: Secondary | ICD-10-CM | POA: Insufficient documentation

## 2023-07-17 DIAGNOSIS — G8929 Other chronic pain: Secondary | ICD-10-CM | POA: Insufficient documentation

## 2023-07-17 DIAGNOSIS — M5442 Lumbago with sciatica, left side: Secondary | ICD-10-CM | POA: Diagnosis not present

## 2023-07-17 DIAGNOSIS — M545 Low back pain, unspecified: Secondary | ICD-10-CM | POA: Diagnosis present

## 2023-07-17 LAB — URINALYSIS, ROUTINE W REFLEX MICROSCOPIC
Bilirubin Urine: NEGATIVE
Glucose, UA: NEGATIVE mg/dL
Hgb urine dipstick: NEGATIVE
Ketones, ur: 5 mg/dL — AB
Nitrite: NEGATIVE
Protein, ur: NEGATIVE mg/dL
Specific Gravity, Urine: 1.011 (ref 1.005–1.030)
pH: 5 (ref 5.0–8.0)

## 2023-07-17 MED ORDER — ETODOLAC 400 MG PO TABS: 400.0000 mg | ORAL_TABLET | Freq: Two times a day (BID) | ORAL | 0 refills | Status: AC

## 2023-07-17 MED ORDER — LIDOCAINE 5 % EX PTCH: 1.0000 | MEDICATED_PATCH | Freq: Two times a day (BID) | CUTANEOUS | 0 refills | Status: AC

## 2023-07-17 MED ORDER — LIDOCAINE 5 % EX PTCH
1.0000 | MEDICATED_PATCH | CUTANEOUS | Status: DC
  Administered 2023-07-17: 1 via TRANSDERMAL
  Filled 2023-07-17: qty 1

## 2023-07-17 NOTE — ED Triage Notes (Signed)
Pt here with lower back and right hip pain. Pt had a steroid epidural on on June 27, 2023 and this past Friday she thinks it has moved. Pt would like to have imaging due to the pain. Pt has been taking OTC pain medication with no relief.

## 2023-07-17 NOTE — Discharge Instructions (Signed)
Call make an appointment with Dr. Myer Haff who is on-call for neurosurgery.  His office is located in [facility].  Prescription for Lidoderm patches and etodolac 400 mg twice daily with food was sent to your pharmacy.  You may ice or use moist heat to your back as needed for discomfort.

## 2023-07-17 NOTE — ED Notes (Signed)
See triage note  Presents with pain to right side of lower back which is moving into right leg  Ambulates with slight limp

## 2023-07-17 NOTE — ED Provider Notes (Signed)
[facility] Provider Note    Event Date/Time   First MD Initiated Contact with Patient 07/17/23 [phone]     (approximate)   History   Back Pain   HPI  Meghan Callahan is a 57 y.o. female presents to the ED with complaint of right sided back pain which radiates down into her right lower extremity.  Patient has been seeing someone at [facility] and recently had an injection of steroids to her back which has not given her any relief.  Patient denies any incontinence of bowel or bladder.  Patient continues to ambulate without any assistance.  No recent injuries.  She initially began having pain approximately 8 months ago without history of injury at her second job.  Patient has a history of hypertension, diabetes and recently diagnosed with multiple sclerosis.     Physical Exam   Triage Vital Signs: ED Triage Vitals [07/17/23 0808]  Encounter Vitals Group     BP (!) 164/80     Systolic BP Percentile      Diastolic BP Percentile      Pulse Rate 96     Resp (!) 21     Temp 98.1 F (36.7 C)     Temp Source Oral     SpO2 99 %     Weight 151 lb 14.4 oz (68.9 kg)     Height 5\' 9"  (1.753 m)     Head Circumference      Peak Flow      Pain Score 10     Pain Loc      Pain Education      Exclude from Growth Chart     Most recent vital signs: Vitals:   07/17/23 0808  BP: (!) 164/80  Pulse: 96  Resp: (!) 21  Temp: 98.1 F (36.7 C)  SpO2: 99%     General: Awake, no distress.  CV:  Good peripheral perfusion.  Resp:  Normal effort.  Abd:  No distention.  Other:  Tenderness noted to the right SI joint area and surrounding muscle tissue.  No point tenderness on palpation of the lower lumbar spine.  Good muscle strength 5/5.  Patient is able to stand and ambulate without any assistance. Straight leg raises 45 degrees left leg with radiation to right lower back.  Right lower extremity at 30 degrees with discomfort right lower back.     ED Results /  Procedures / Treatments   Labs (all labs ordered are listed, but only abnormal results are displayed) Labs Reviewed  URINALYSIS, ROUTINE W REFLEX MICROSCOPIC - Abnormal; Notable for the following components:      Result Value   Color, Urine YELLOW (*)    APPearance HAZY (*)    Ketones, ur 5 (*)    Leukocytes,Ua TRACE (*)    Bacteria, UA FEW (*)    All other components within normal limits      PROCEDURES:  Critical Care performed:   Procedures   MEDICATIONS ORDERED IN ED: Medications  lidocaine (LIDODERM) 5 % 1 patch (1 patch Transdermal Patch Applied 07/17/23 1010)     IMPRESSION / MDM / ASSESSMENT AND PLAN / ED COURSE  I reviewed the triage vital signs and the nursing notes.   Differential diagnosis includes, but is not limited to, chronic back pain, strain, degenerative disc disease, sciatica, cauda equina was considered.  57 year old female presents to the ED with complaint of low back pain with radiation into her right lower extremity which  she feels is worse then when she was seen at [facility] on 06/27/2023.  Patient at that time received an epidural for her back pain that has been ongoing for 8 months.  Urinalysis wrist reassuring.  Physical exam consistent with sciatica.  I did review the report from her MRI that was done at [facility] which showed disc bulging at L5-S1 area with narrowing of the canal space.  I discussed with her a referral to neurosurgery which she was willing at this time.  Lidoderm patch and etodolac 400 mg twice daily was sent to the pharmacy for her to begin using until she can be seen either by her doctor at [facility] or Dr. Marcell Barlow who is on-call for neurosurgery.      Patient's presentation is most consistent with acute complicated illness / injury requiring diagnostic workup.  FINAL CLINICAL IMPRESSION(S) / ED DIAGNOSES   Final diagnoses:  Chronic low back pain with bilateral sciatica, unspecified back pain laterality     Rx / DC Orders   ED  Discharge Orders          Ordered    etodolac (LODINE) 400 MG tablet  2 times daily        07/17/23 1054    lidocaine (LIDODERM) 5 %  Every 12 hours        07/17/23 1054             Note:  This document was prepared using Dragon voice recognition software and may include unintentional dictation errors.   Tommi Rumps, PA-C 07/17/23 1359    Sharman Cheek, MD 07/17/23 [phone]

## 2023-07-24 ENCOUNTER — Inpatient Hospital Stay
Admission: RE | Admit: 2023-07-24 | Discharge: 2023-07-24 | Disposition: A | Discharge status: Home / Self Care | Payer: Self-pay | Source: Ambulatory Visit | Attending: Neurosurgery | Admitting: Neurosurgery

## 2023-07-24 ENCOUNTER — Other Ambulatory Visit: Payer: Self-pay

## 2023-07-24 DIAGNOSIS — Z049 Encounter for examination and observation for unspecified reason: Secondary | ICD-10-CM

## 2023-07-25 NOTE — Progress Notes (Unsigned)
Referring Physician:  Barbette Reichmann, MD [address] [facility] [place],  [place] [address]  Primary Physician:  Barbette Reichmann, MD  History of Present Illness: 07/26/2023 Meghan Callahan is here today with a chief complaint of right sided low back pain with radiation into the right leg down to the knee.  She has had pain in the left leg as well, but her right leg pain is worse.  She began having pain approximately 5 years ago, but is been worse over the last 8 months or so.  The pain is exacerbated by lying down, bending, stooping, squatting, and reaching. The patient can walk for approximately 10 minutes or 300 yards before the pain becomes unbearable.  She sits down or lays down to make it better.  However, extended sitting can also be painful.  Standing and walking is the worst.  The pain level on the day of the consultation was reported as 6-7, even after taking 400mg  of Etodolac. The patient can only stand for about 5 minutes before the back pain forces her to stop.  The patient has tried an injection for the pain, which did not provide significant relief. She also attended physical therapy once but found it not immediately beneficial and financially burdensome. The patient has been managing her MS well, with no reported flares or relapses since 2000. Her diabetes is also under control, with recent improvements noted in her A1C levels from 11 two years ago to 7.9 currently.  The patient is a current smoker, consuming one cigarette a day and using a 21mg  nicotine patch. She has a plan to gradually wean off nicotine. The patient also reports right-sided back pain, which does not extend below the knee but is present in the upper buttock. The pain is described as a 'catch' that triggers when she starts to have pain.  The patient's back pain began in 2016 and has progressively worsened. The pain was aggravated while working at an [employer] center in 2020.  She has been managing the pain with Etodolac and Gabapentin, taken at night. The patient has also tried physical therapy once but did not find it immediately beneficial. 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     Bowel/Bladder Dysfunction: none  Conservative measures:  Physical therapy:  has attempted therapy and feels she is not disciplined enough to continue. Multimodal medical therapy including regular antiinflammatories:  etodolac, gabapentin, meloxicam, tizanidine, Lidocaine Injections:  has received epidural steroid injections 06/27/23: Right L4-5 IL ESI (Dr. Verdie Mosher) no help  Past Surgery: no previous spinal surgeries   Meghan Callahan has no symptoms of cervical myelopathy.  The symptoms are causing a significant impact on the patient's life.   I have utilized the care everywhere function in epic to review the outside records available from external health systems.  Review of Systems:  A 10 point review of systems is negative, except for the pertinent positives and negatives detailed in the HPI.  Past Medical History: Past Medical History:  Diagnosis Date   Diabetes mellitus without complication (HCC)    Hypertension    MS (multiple sclerosis) (HCC)     Past Surgical History: Past Surgical History:  Procedure Laterality Date   BREAST BIOPSY      Allergies: Allergies as of 07/26/2023 - Review Complete 07/17/2023  Allergen Reaction Noted   Corticosteroids Other (See Comments) 04/10/2014   Gramineae pollens Itching 12/31/2020    Medications:  Current Outpatient Medications:    Biotin 1000 MCG tablet, Take 1,000 mcg by mouth daily., Disp: , Rfl:    Cholecalciferol (D 1000) 25 MCG (1000 UT) capsule, Take 1,000 Units by mouth daily., Disp: , Rfl:    Continuous Glucose Receiver (DEXCOM G6 RECEIVER) DEVI, Use to monitor blood sugar., Disp: , Rfl:    escitalopram (LEXAPRO) 10 MG tablet, Take 10 mg by  mouth daily., Disp: , Rfl:    meloxicam (MOBIC) 15 MG tablet, Take 15 mg by mouth daily., Disp: , Rfl:    rosuvastatin (CRESTOR) 10 MG tablet, Take 10 mg by mouth daily., Disp: , Rfl:    aluminum chloride (DRYSOL) 20 % external solution, Apply topically 2 (two) times daily., Disp: , Rfl:    Continuous Glucose Sensor (DEXCOM G7 SENSOR) MISC, CHANGE SENSOR EVERY 10 DAYS, Disp: , Rfl:    Continuous Glucose Transmitter (DEXCOM G6 TRANSMITTER) MISC, USE 1 EVERY 3 MONTHS AS DIRECTED, Disp: , Rfl:    Dimethyl Fumarate 240 MG CPDR, Take by mouth., Disp: , Rfl:    etodolac (LODINE) 400 MG tablet, Take 1 tablet (400 mg total) by mouth 2 (two) times daily., Disp: 20 tablet, Rfl: 0   ferrous sulfate 325 (65 FE) MG tablet, Take by mouth., Disp: , Rfl:    gabapentin (NEURONTIN) 100 MG capsule, Take 100 mg by mouth at bedtime as needed., Disp: , Rfl:    gabapentin (NEURONTIN) 300 MG capsule, Take 600 mg by mouth at bedtime., Disp: , Rfl:    hydrochlorothiazide (HYDRODIURIL) 25 MG tablet, Take 1 tablet (25 mg total) by mouth daily., Disp: 30 tablet, Rfl: 1   insulin lispro (HUMALOG) 100 UNIT/ML injection, Inject into the skin once., Disp: , Rfl:    irbesartan-hydrochlorothiazide (AVALIDE) 150-12.5 MG tablet, Take 1 tablet by mouth daily., Disp: , Rfl:    lidocaine (LIDODERM) 5 %, Place 1 patch onto the skin every 12 (twelve) hours. Remove & Discard patch within 12 hours or as directed by MD, Disp: 10 patch, Rfl: 0   magnesium oxide (MAG-OX) 400 MG tablet, Take 400 mg by mouth daily., Disp: , Rfl:    nicotine (NICODERM CQ - DOSED IN MG/24 HOURS) 21 mg/24hr patch, Place 21 mg onto the skin daily., Disp: , Rfl:    Sodium Caprylate POWD, Take by mouth., Disp: , Rfl:    valACYclovir (VALTREX) 500 MG tablet, Take 500 mg by mouth 2 (two) times daily., Disp: , Rfl:    valsartan-hydrochlorothiazide (DIOVAN-HCT) 160-12.5 MG tablet, Take 1 tablet by mouth daily., Disp: , Rfl:   Social History: Social History   Tobacco  Use   Smoking status: Former    Current packs/day: 0.00    Types: Cigarettes    Quit date: 09/29/2017    Years since quitting: 5.8   Smokeless tobacco: Never  Vaping Use   Vaping status: Never Used  Substance Use Topics   Alcohol use: Never   Drug use: Never    Family Medical History: Family History  Problem Relation Age of Onset   Breast cancer Neg Hx     Physical Examination: Vitals:   07/26/23 1324  BP: 112/72    General: Patient is in no apparent distress. Attention to examination is appropriate.  Neck:   Supple.  Full range of motion.  Respiratory: Patient is breathing without any difficulty.   NEUROLOGICAL:     Awake, alert, oriented to person, place, and time.  Speech is clear and fluent.   Cranial Nerves: Pupils equal round  and reactive to light.  Facial tone is symmetric.  Facial sensation is symmetric. Shoulder shrug is symmetric. Tongue protrusion is midline.  There is no pronator drift.  Strength: Side Biceps Triceps Deltoid Interossei Grip Wrist Ext. Wrist Flex.  R 5 5 5 5 5 5 5   L 5 5 5 5 5 5 5    Side Iliopsoas Quads Hamstring PF DF EHL  R 5 5 5 5 5 5   L 5 5 5 5 5 5    Reflexes are 1+ and symmetric at the biceps, triceps, brachioradialis, patella and achilles.   Hoffman's is absent.   Bilateral upper and lower extremity sensation is intact to light touch.    No evidence of dysmetria noted.  Gait is antalgic. +SLR at 60 degrees on R     Medical Decision Making  Imaging: MRI L spine 05/05/2023 FINDINGS:  Anatomical variants: Conventional spinal numbering   Conus: The conus is normal in appearance and position, terminating at L2.   Alignment: Vertebral numbering on grade 2 anterolisthesis L4 on L5,  increased from prior.  Marrow: Mild edema-like signal at the L4-L5 endplate, presumed discogenic.    Sacrum/sacroiliac joints: No significant degenerative changes of the  visualized SI joints  Regional soft tissues: T2 hyperintense left  interpolar renal focus,  presumed cysts though technically incompletely assessed.    T11-T12 seen only on the sagittal images; small posterior disc bulge versus  protrusion with moderate canal narrowing. Mild bilateral foraminal  narrowing at this level.   T12-L1: No disc protrusion or canal narrowing. No right and no left  neuroforaminal narrowing.   L1-L2: No disc protrusion or canal narrowing. No right and no left  neuroforaminal narrowing.   L2-L3: No disc protrusion or canal narrowing. Bilateral facet arthrosis. No  right and no left neuroforaminal narrowing.   L3-L4: No disc protrusion or canal narrowing. Bilateral facet arthrosis. No  right and no left neuroforaminal narrowing.   L4-L5: Uncovering of disc with superimposed bulge. Infolding ligament  flavum. Moderate bilateral facet arthrosis. Findings contribute to  moderate-to-severe canal narrowing. Mild bilateral left neuroforaminal  narrowing.   L5-S1: No disc protrusion or canal narrowing. Mild facet arthrosis. No  neuroforaminal narrowing.    IMPRESSION:  High-grade canal narrowing at L4-L5, with grade 1 bordering on grade 2  anterolisthesis.   Electronically Signed by:  Pearletha Forge, MD, [facility]  Electronically Signed on:  05/06/2023 11:26 AM    She has standing x-rays from January 29, 2023 which shows a 10 mm anterolisthesis of L4 and L5.  Additionally, there is a 9 degree kyphosis across the L4-5 disc level.  I have personally reviewed the images and agree with the above interpretation.  Assessment and Plan: Ms. Grevious is a pleasant 56 y.o. female with spondylolisthesis at L4-5 with lumbar spinal instability at this level.  She is unstable as evidenced by 6 mm anterolisthesis on supine MRI scan that extends to 10 mm on standing x-rays.  Additionally, her L4/5 disc space shows a 9 degree kyphosis on standing and a 1 degree lordosis on lying down.  She has had 1 visit of physical therapy.  She did not  have significant improvement in her pain.  I have recommended going back for reevaluation.  I suspect she will not improve with physical therapy given the severity of her anterolisthesis.  Lumbar Spondylolisthesis with instability, R sided sciatica Chronic back pain exacerbated by standing, bending, stooping, squatting, and reaching. Pain radiates to the right buttock and posterior thigh.  X-ray and MRI confirm diagnosis. Discussed the risks/benefits of surgical intervention (fusion) vs conservative management. -Continue Etodolac 400mg  for pain management.  -Add muscle relaxant for additional pain control. -Return to physical therapy for a few more sessions to meet insurance requirements. -Plan for surgical intervention (fusion) once patient has ceased nicotine use for 30 days and blood sugar is well-controlled (A1c <7.5%).  Type 2 Diabetes Improved control with Omnipod pump, recent A1c 7.9% down from 8.9%. -Continue current management and strict diet control to achieve A1c <7.5% prior to potential surgery.  Tobacco Use Currently smoking one cigarette per day and using 21mg  nicotine patch. Discussed the risks of continued smoking and the benefits of cessation, particularly in the context of potential surgery. -Encouraged to continue weaning off cigarettes and eventually nicotine patches. -Plan to test for nicotine use in 30 days.  Follow-up in 6 weeks to reassess back pain, nicotine use, and diabetes control.  I spent a total of 45 minutes in this patient's care today. This time was spent reviewing pertinent records including imaging studies, obtaining and confirming history, performing a directed evaluation, formulating and discussing my recommendations, and documenting the visit within the medical record.   Thank you for involving me in the care of this patient.      Doral Digangi K. Myer Haff MD, [facility] Neurosurgery

## 2023-07-26 ENCOUNTER — Ambulatory Visit: Payer: 59 | Admitting: Neurosurgery

## 2023-07-26 ENCOUNTER — Encounter: Payer: Self-pay | Admitting: Neurosurgery

## 2023-07-26 VITALS — BP 112/72 | Ht 69.0 in | Wt 178.0 lb

## 2023-07-26 DIAGNOSIS — E119 Type 2 diabetes mellitus without complications: Secondary | ICD-10-CM | POA: Diagnosis not present

## 2023-07-26 DIAGNOSIS — B009 Herpesviral infection, unspecified: Secondary | ICD-10-CM | POA: Insufficient documentation

## 2023-07-26 DIAGNOSIS — M4316 Spondylolisthesis, lumbar region: Secondary | ICD-10-CM

## 2023-07-26 DIAGNOSIS — N939 Abnormal uterine and vaginal bleeding, unspecified: Secondary | ICD-10-CM | POA: Insufficient documentation

## 2023-07-26 DIAGNOSIS — G8929 Other chronic pain: Secondary | ICD-10-CM

## 2023-07-26 DIAGNOSIS — M532X6 Spinal instabilities, lumbar region: Secondary | ICD-10-CM

## 2023-07-26 DIAGNOSIS — M5441 Lumbago with sciatica, right side: Secondary | ICD-10-CM

## 2023-07-26 DIAGNOSIS — Z720 Tobacco use: Secondary | ICD-10-CM

## 2023-07-27 ENCOUNTER — Encounter: Payer: Self-pay | Admitting: Neurosurgery

## 2023-07-28 ENCOUNTER — Other Ambulatory Visit: Payer: Self-pay | Admitting: Neurosurgery

## 2023-07-28 DIAGNOSIS — M4316 Spondylolisthesis, lumbar region: Secondary | ICD-10-CM

## 2023-07-28 DIAGNOSIS — M532X6 Spinal instabilities, lumbar region: Secondary | ICD-10-CM

## 2023-07-28 MED ORDER — METHOCARBAMOL 500 MG PO TABS: 500.0000 mg | ORAL_TABLET | Freq: Three times a day (TID) | ORAL | 0 refills | Status: AC

## 2023-07-28 NOTE — Progress Notes (Signed)
Sent in starting robaxin script

## 2023-07-31 ENCOUNTER — Other Ambulatory Visit: Payer: Self-pay | Admitting: Internal Medicine

## 2023-07-31 DIAGNOSIS — N6489 Other specified disorders of breast: Secondary | ICD-10-CM

## 2023-08-06 NOTE — Telephone Encounter (Signed)
Spoke to patient and informed her that we have not received the papers from her work. She will email the forms to [employer]. She also states she was seen in ED at [facility]. She has changed the medication. She is now on Toradol and Robaxin 1000mg .

## 2023-08-10 ENCOUNTER — Inpatient Hospital Stay: Admission: RE | Admit: 2023-08-10 | Payer: 59 | Source: Ambulatory Visit

## 2023-08-10 ENCOUNTER — Other Ambulatory Visit: Payer: 59

## 2023-08-20 NOTE — Telephone Encounter (Signed)
Appt scheduled for 10/02/23, she will not start PT until 09/13/2023. She states you told her to do at least 2 visits.

## 2023-08-20 NOTE — Telephone Encounter (Signed)
How much time would you like to schedule her out of work for? I have the forms.

## 2023-08-21 ENCOUNTER — Encounter: Payer: Self-pay | Admitting: Neurosurgery

## 2023-08-22 ENCOUNTER — Emergency Department
Admission: EM | Admit: 2023-08-22 | Discharge: 2023-08-22 | Disposition: A | Discharge status: Home / Self Care | Payer: 59 | Attending: Emergency Medicine | Admitting: Emergency Medicine

## 2023-08-22 ENCOUNTER — Other Ambulatory Visit: Payer: Self-pay

## 2023-08-22 DIAGNOSIS — M79604 Pain in right leg: Secondary | ICD-10-CM | POA: Diagnosis present

## 2023-08-22 HISTORY — DX: Anemia, unspecified: D64.9

## 2023-08-22 HISTORY — DX: Unspecified optic neuritis: H46.9

## 2023-08-22 HISTORY — DX: Herpesviral infection of urogenital system, unspecified: A60.00

## 2023-08-22 HISTORY — DX: Unspecified osteoarthritis, unspecified site: M19.90

## 2023-08-22 HISTORY — DX: Hyperlipidemia, unspecified: E78.5

## 2023-08-22 LAB — CBG MONITORING, ED: Glucose-Capillary: 418 mg/dL — ABNORMAL HIGH (ref 70–99)

## 2023-08-22 MED ORDER — HYDROCODONE-ACETAMINOPHEN 5-325 MG PO TABS
2.0000 | ORAL_TABLET | Freq: Once | ORAL | Status: AC
  Administered 2023-08-22: 2 via ORAL
  Filled 2023-08-22: qty 2

## 2023-08-22 MED ORDER — HYDROCODONE-ACETAMINOPHEN 5-325 MG PO TABS: 1.0000 | ORAL_TABLET | Freq: Four times a day (QID) | ORAL | 0 refills | Status: DC | PRN

## 2023-08-22 NOTE — ED Provider Notes (Signed)
[facility] Provider Note    Event Date/Time   First MD Initiated Contact with Patient 08/22/23 1637     (approximate)   History   Leg Pain   HPI  Meghan Callahan is a 57 y.o. female who presents with acute on chronic right hip/leg pain.  Patient is scheduled to see neurosurgery for this.  Has had fairly extensive workup.  She reports pain is worsened over the last several days.  No numbness or weakness.  Is ambulating but it is painful and preventing her from being able to work.  She reports she has been taking ibuprofen frequently for this     Physical Exam   Triage Vital Signs: ED Triage Vitals  Encounter Vitals Group     BP 08/22/23 1553 129/75     Systolic BP Percentile --      Diastolic BP Percentile --      Pulse Rate 08/22/23 1553 97     Resp 08/22/23 1553 18     Temp 08/22/23 1553 98.4 F (36.9 C)     Temp Source 08/22/23 1553 Oral     SpO2 08/22/23 1553 99 %     Weight 08/22/23 1554 80.7 kg (178 lb)     Height 08/22/23 1554 1.753 m (5\' 9" )     Head Circumference --      Peak Flow --      Pain Score 08/22/23 1554 10     Pain Loc --      Pain Education --      Exclude from Growth Chart --     Most recent vital signs: Vitals:   08/22/23 1553  BP: 129/75  Pulse: 97  Resp: 18  Temp: 98.4 F (36.9 C)  SpO2: 99%     General: Awake, no distress.  CV:  Good peripheral perfusion.  Resp:  Normal effort.  Abd:  No distention.  Other:  Normal range of motion of the lower extremity, he is able to ambulate well, sensation intact, warm and well-perfused   ED Results / Procedures / Treatments   Labs (all labs ordered are listed, but only abnormal results are displayed) Labs Reviewed  CBG MONITORING, ED - Abnormal; Notable for the following components:      Result Value   Glucose-Capillary 418 (*)    All other components within normal limits     EKG     RADIOLOGY     PROCEDURES:  Critical Care performed:    Procedures   MEDICATIONS ORDERED IN ED: Medications  HYDROcodone-acetaminophen (NORCO/VICODIN) 5-325 MG per tablet 2 tablet (2 tablets Oral Given 08/22/23 1712)     IMPRESSION / MDM / ASSESSMENT AND PLAN / ED COURSE  I reviewed the triage vital signs and the nursing notes. Patient's presentation is most consistent with exacerbation of chronic illness.  Patient with acute on chronic pain, will trial brief course of Norco for relief, she has follow-up with neurosurgery.  No red flag symptoms, no neurodeficits.        FINAL CLINICAL IMPRESSION(S) / ED DIAGNOSES   Final diagnoses:  Right leg pain     Rx / DC Orders   ED Discharge Orders          Ordered    HYDROcodone-acetaminophen (NORCO/VICODIN) 5-325 MG tablet  Every 6 hours PRN        08/22/23 1659             Note:  This document was prepared using Dragon  voice recognition software and may include unintentional dictation errors.   Jene Every, MD 08/22/23 [phone]

## 2023-08-22 NOTE — ED Triage Notes (Signed)
Pt presents to ED with c/o of R leg pain, pt states HX of same and being worked up for same at [facility]. NAD noted.

## 2023-08-22 NOTE — ED Notes (Signed)
See triage notes. Patient c/o right leg pain. Patient has hx of same. Patient BGL is 418

## 2023-08-29 ENCOUNTER — Other Ambulatory Visit: Payer: Self-pay | Admitting: Internal Medicine

## 2023-08-29 ENCOUNTER — Ambulatory Visit
Admission: RE | Admit: 2023-08-29 | Discharge: 2023-08-29 | Disposition: A | Discharge status: Home / Self Care | Payer: 59 | Source: Ambulatory Visit | Attending: Internal Medicine | Admitting: Internal Medicine

## 2023-08-29 DIAGNOSIS — R928 Other abnormal and inconclusive findings on diagnostic imaging of breast: Secondary | ICD-10-CM

## 2023-08-29 DIAGNOSIS — N6489 Other specified disorders of breast: Secondary | ICD-10-CM

## 2023-08-29 DIAGNOSIS — N63 Unspecified lump in unspecified breast: Secondary | ICD-10-CM

## 2023-09-06 ENCOUNTER — Ambulatory Visit: Payer: 59 | Admitting: Neurosurgery

## 2023-10-02 ENCOUNTER — Encounter: Payer: Self-pay | Admitting: Neurosurgery

## 2023-10-02 ENCOUNTER — Ambulatory Visit (INDEPENDENT_AMBULATORY_CARE_PROVIDER_SITE_OTHER): Payer: 59 | Admitting: Neurosurgery

## 2023-10-02 VITALS — BP 138/86 | Ht 69.0 in | Wt 178.0 lb

## 2023-10-02 DIAGNOSIS — M4316 Spondylolisthesis, lumbar region: Secondary | ICD-10-CM

## 2023-10-02 DIAGNOSIS — M532X6 Spinal instabilities, lumbar region: Secondary | ICD-10-CM

## 2023-10-02 DIAGNOSIS — F17210 Nicotine dependence, cigarettes, uncomplicated: Secondary | ICD-10-CM

## 2023-10-02 DIAGNOSIS — G8929 Other chronic pain: Secondary | ICD-10-CM

## 2023-10-02 NOTE — Progress Notes (Signed)
Referring Physician:  No referring provider defined for this encounter.  Primary Physician:  Barbette Reichmann, MD  History of Present Illness: 10/02/2023 Meghan Callahan presents for reevaluation.  She has been going to physical therapy.  She has continued with good sugar control.  She has continued to utilize nicotine.  She has substantial life stressors that are currently impacting her.   07/26/2023 Meghan Callahan is here today with a chief complaint of right sided low back pain with radiation into the right leg down to the knee.  She has had pain in the left leg as well, but her right leg pain is worse.  She began having pain approximately 5 years ago, but is been worse over the last 8 months or so.  The pain is exacerbated by lying down, bending, stooping, squatting, and reaching. The patient can walk for approximately 10 minutes or 300 yards before the pain becomes unbearable.  She sits down or lays down to make it better.  However, extended sitting can also be painful.  Standing and walking is the worst.  The pain level on the day of the consultation was reported as 6-7, even after taking 400mg  of Etodolac. The patient can only stand for about 5 minutes before the back pain forces her to stop.  The patient has tried an injection for the pain, which did not provide significant relief. She also attended physical therapy once but found it not immediately beneficial and financially burdensome. The patient has been managing her MS well, with no reported flares or relapses since 2000. Her diabetes is also under control, with recent improvements noted in her A1C levels from 11 two years ago to 7.9 currently.  The patient is a current smoker, consuming one cigarette a day and using a 21mg  nicotine patch. She has a plan to gradually wean off nicotine. The patient also reports right-sided back pain, which does not extend below the knee but is present in the upper buttock. The pain is  described as a 'catch' that triggers when she starts to have pain.  The patient's back pain began in 2016 and has progressively worsened. The pain was aggravated while working at an [employer] center in 2020. She has been managing the pain with Etodolac and Gabapentin, taken at night. The patient has also tried physical therapy once but did not find it immediately beneficial. 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     Bowel/Bladder Dysfunction: none  Conservative measures:  Physical therapy:  has attempted therapy and feels she is not disciplined enough to continue. Multimodal medical therapy including regular antiinflammatories:  etodolac, gabapentin, meloxicam, tizanidine, Lidocaine Injections:  has received epidural steroid injections 06/27/23: Right L4-5 IL ESI (Dr. Verdie Mosher) no help  Past Surgery: no previous spinal surgeries   Meghan Callahan has no symptoms of cervical myelopathy.  The symptoms are causing a significant impact on the patient's life.   I have utilized the care everywhere function in epic to review the outside records available from external health systems.  Review of Systems:  A 10 point review of systems is negative, except for the pertinent positives and negatives detailed in the HPI.  Past Medical History: Past Medical History:  Diagnosis Date   Anemia    Arthritis    Diabetes mellitus without complication (HCC)    Genital HSV    Hyperlipidemia    Hypertension    MS (multiple sclerosis) (HCC)  Optic neuritis     Past Surgical History: Past Surgical History:  Procedure Laterality Date   BREAST BIOPSY     DILATION & CURRETTAGE     ENDOMETRIAL ABLATION     LAPAROSCOPIC TUBAL LIGATION      Allergies: Allergies as of 10/02/2023 - Review Complete 10/02/2023  Allergen Reaction Noted   Corticosteroids Other (See Comments) 04/10/2014   Gramineae pollens Itching 12/31/2020     Medications:  Current Outpatient Medications:    aluminum chloride (DRYSOL) 20 % external solution, Apply topically 2 (two) times daily., Disp: , Rfl:    Biotin 1000 MCG tablet, Take 1,000 mcg by mouth daily., Disp: , Rfl:    Cholecalciferol (D 1000) 25 MCG (1000 UT) capsule, Take 1,000 Units by mouth daily., Disp: , Rfl:    Continuous Glucose Sensor (DEXCOM G7 SENSOR) MISC, CHANGE SENSOR EVERY 10 DAYS, Disp: , Rfl:    Dimethyl Fumarate 240 MG CPDR, Take by mouth., Disp: , Rfl:    escitalopram (LEXAPRO) 10 MG tablet, Take 10 mg by mouth daily., Disp: , Rfl:    etodolac (LODINE) 400 MG tablet, Take 1 tablet (400 mg total) by mouth 2 (two) times daily., Disp: 20 tablet, Rfl: 0   ferrous sulfate 325 (65 FE) MG tablet, Take by mouth., Disp: , Rfl:    gabapentin (NEURONTIN) 100 MG capsule, Take 100 mg by mouth at bedtime as needed., Disp: , Rfl:    gabapentin (NEURONTIN) 300 MG capsule, Take 600 mg by mouth at bedtime., Disp: , Rfl:    hydrochlorothiazide (HYDRODIURIL) 25 MG tablet, Take 1 tablet (25 mg total) by mouth daily., Disp: 30 tablet, Rfl: 1   insulin lispro (HUMALOG) 100 UNIT/ML injection, Inject into the skin once., Disp: , Rfl:    irbesartan-hydrochlorothiazide (AVALIDE) 150-12.5 MG tablet, Take 1 tablet by mouth daily., Disp: , Rfl:    ketorolac (TORADOL) 10 MG tablet, Take 10 mg by mouth every 6 (six) hours as needed., Disp: , Rfl:    lidocaine (LIDODERM) 5 %, Place 1 patch onto the skin every 12 (twelve) hours. Remove & Discard patch within 12 hours or as directed by MD, Disp: 10 patch, Rfl: 0   magnesium oxide (MAG-OX) 400 MG tablet, Take 400 mg by mouth daily., Disp: , Rfl:    meloxicam (MOBIC) 15 MG tablet, Take 15 mg by mouth daily., Disp: , Rfl:    nicotine (NICODERM CQ - DOSED IN MG/24 HOURS) 21 mg/24hr patch, Place 21 mg onto the skin daily., Disp: , Rfl:    rosuvastatin (CRESTOR) 10 MG tablet, Take 10 mg by mouth daily., Disp: , Rfl:    Sodium Caprylate POWD, Take by  mouth., Disp: , Rfl:    valACYclovir (VALTREX) 500 MG tablet, Take 500 mg by mouth 2 (two) times daily., Disp: , Rfl:    valsartan-hydrochlorothiazide (DIOVAN-HCT) 160-12.5 MG tablet, Take 1 tablet by mouth daily., Disp: , Rfl:    Continuous Glucose Receiver (DEXCOM G6 RECEIVER) DEVI, Use to monitor blood sugar. (Patient not taking: Reported on 10/02/2023), Disp: , Rfl:    Continuous Glucose Transmitter (DEXCOM G6 TRANSMITTER) MISC, USE 1 EVERY 3 MONTHS AS DIRECTED (Patient not taking: Reported on 10/02/2023), Disp: , Rfl:   Social History: Social History   Tobacco Use   Smoking status: Every Day    Current packs/day: 0.00    Types: Cigarettes    Last attempt to quit: 09/29/2017    Years since quitting: 6.0   Smokeless tobacco: Never  Vaping Use  Vaping status: Never Used  Substance Use Topics   Alcohol use: Never   Drug use: Never    Family Medical History: Family History  Problem Relation Age of Onset   Breast cancer Neg Hx     Physical Examination: There were no vitals filed for this visit.   Medical Decision Making  Imaging: MRI L spine 05/05/2023 FINDINGS:  Anatomical variants: Conventional spinal numbering   Conus: The conus is normal in appearance and position, terminating at L2.   Alignment: Vertebral numbering on grade 2 anterolisthesis L4 on L5,  increased from prior.  Marrow: Mild edema-like signal at the L4-L5 endplate, presumed discogenic.    Sacrum/sacroiliac joints: No significant degenerative changes of the  visualized SI joints  Regional soft tissues: T2 hyperintense left interpolar renal focus,  presumed cysts though technically incompletely assessed.    T11-T12 seen only on the sagittal images; small posterior disc bulge versus  protrusion with moderate canal narrowing. Mild bilateral foraminal  narrowing at this level.   T12-L1: No disc protrusion or canal narrowing. No right and no left  neuroforaminal narrowing.   L1-L2: No disc protrusion  or canal narrowing. No right and no left  neuroforaminal narrowing.   L2-L3: No disc protrusion or canal narrowing. Bilateral facet arthrosis. No  right and no left neuroforaminal narrowing.   L3-L4: No disc protrusion or canal narrowing. Bilateral facet arthrosis. No  right and no left neuroforaminal narrowing.   L4-L5: Uncovering of disc with superimposed bulge. Infolding ligament  flavum. Moderate bilateral facet arthrosis. Findings contribute to  moderate-to-severe canal narrowing. Mild bilateral left neuroforaminal  narrowing.   L5-S1: No disc protrusion or canal narrowing. Mild facet arthrosis. No  neuroforaminal narrowing.    IMPRESSION:  High-grade canal narrowing at L4-L5, with grade 1 bordering on grade 2  anterolisthesis.   Electronically Signed by:  Pearletha Forge, MD, [facility]  Electronically Signed on:  05/06/2023 11:26 AM    She has standing x-rays from January 29, 2023 which shows a 10 mm anterolisthesis of L4 and L5.  Additionally, there is a 9 degree kyphosis across the L4-5 disc level.  I have personally reviewed the images and agree with the above interpretation.  Assessment and Plan: Meghan Callahan is a pleasant 57 y.o. female with spondylolisthesis at L4-5 with lumbar spinal instability at this level.  She is unstable as evidenced by 6 mm anterolisthesis on supine MRI scan that extends to 10 mm on standing x-rays.  Additionally, her L4/5 disc space shows a 9 degree kyphosis on standing and a 1 degree lordosis on lying down.  I think she is unlikely to improve with conservative management, and should consider surgical intervention with L4/5 transforaminal lumbar interbody fusion(TLIF).  That being said, we reviewed that I would like her to discontinue use of nicotine before considering surgical intervention.  If she quits today or tomorrow, we will test her on January 2.  If that test is negative, we will be ready to move forward.  I would be happy to plan her  surgery after she reports that she has discontinued nicotine products.  I reviewed with her that use of nicotine decreases the fusion rate by approximately 50% with the surgery.  She should continue her physical therapy for now.  Once she has discontinued using nicotine, will be ready to move forward.  I spent a total of 45 minutes in this patient's care today. This time was spent reviewing pertinent records including imaging studies, obtaining and confirming history,  performing a directed evaluation, formulating and discussing my recommendations, and documenting the visit within the medical record.   Thank you for involving me in the care of this patient.      Rhianon Zabawa K. Myer Haff MD, [facility]

## 2023-12-04 ENCOUNTER — Encounter: Payer: Self-pay | Admitting: Neurosurgery

## 2024-03-27 NOTE — Progress Notes (Signed)
 Chief Complaint  Patient presents with  . Annual Exam    Physical     HPI  Meghan Callahan is a 58 y.o. here for an Annual Physical  Hx of Multiple Sclerosis- first diagnosed in 2001 and Type 1 DM at age 34. 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 Married- 1 daughter Recent labs: Hgb;12.7 Sugar;110 se Creat; 0.9 A1c; 7.8 Total Cholesterol:179 Triglycerides; 63 B12; 978 and TSH: 2.546   ROS Rest of 10 point review of systems is normal.  Outpatient Encounter Medications as of 03/27/2024  Medication Sig Dispense Refill  . biotin 1 mg tablet Take 1,000 mcg by mouth once daily.    . blood-glucose meter,continuous (DEXCOM G7 RECEIVER) Misc Use    . blood-glucose sensor (DEXCOM G7 SENSOR) Devi Use 1 each every 10 (ten) days    . buPROPion (WELLBUTRIN SR) 150 MG SR tablet Take 1 tablet (150 mg total) by mouth 2 (two) times daily 60 tablet 11  . cholecalciferol (VITAMIN D3) 1,000 unit capsule Take 1 capsule (1,000 Units total) by mouth once daily. 30 capsule 5  . dimethyl fumarate (TECFIDERA) 240 mg DR capsule Take 1 capsule (240 mg total) by mouth 2 (two) times daily 60 capsule 11  . escitalopram oxalate (LEXAPRO) 10 MG tablet TAKE 1 TABLET(10 MG) BY MOUTH EVERY DAY 90 tablet 1  . ferrous sulfate 325 (65 FE) MG tablet Take 325 mg by mouth once daily.    SABRA gabapentin (NEURONTIN) 100 MG capsule Take 1 capsule (100 mg total) by mouth at bedtime for 180 days 90 capsule 1  . gabapentin (NEURONTIN) 300 MG capsule 2 capsules q HS 180  capsule 3  . glucagon (GLUCAGON) 1 mg injection syringe kit Inject 1 mg into the muscle once as needed for Low blood sugar.    . insulin LISPRO (ADMELOG, HUMALOG) injection (concentration 100 units/mL) ADMINISTER 6 TO 15 UNITS UNDER THE SKIN TWICE DAILY BEFORE MEALS. MAX DOSE OF 30 UNITS DAILY 30 mL 0  . magnesium oxide (MAG-OX) 400 mg (241.3 mg magnesium) tablet Take 400 mg by mouth once daily    . meloxicam (MOBIC) 15 MG tablet Take 1 tablet (15 mg total) by mouth once daily as needed for Pain 90 tablet 1  . rosuvastatin (CRESTOR) 10 MG tablet Take 1 tablet (10 mg total) by mouth once daily 90 tablet 1  . tirzepatide (MOUNJARO) 2.5 mg/0.5 mL pen injector INJECT 2.5 MG UNDER THE SKIN ONCE WEEK 2 mL 0  . valACYclovir (VALTREX) 500 MG tablet Take 1 tablet (500 mg total) by mouth once daily as needed 30 tablet 5  . valsartan-hydroCHLOROthiazide  (DIOVAN-HCT) 160-12.5 mg tablet take 1 tablet by mouth every day 90 tablet 3  . BD NANO 2ND GEN PEN NEEDLE 32 gauge x 5/32 Ndle 1 each once daily (Patient not taking: Reported on 11/20/2023)    . nicotine (NICODERM CQ) 21 mg/24 hr patch Place 1 patch onto the skin daily (Patient not taking: Reported on  02/13/2024)    . tiZANidine (ZANAFLEX) 2 MG tablet Take 2 mg by mouth every 8 (eight) hours as needed    . tumeric-ging-olive-oreg-capryl 100 mg-150 mg- 50 mg-150 mg Cap Take by mouth (Patient not taking: Reported on 02/13/2024)     No facility-administered encounter medications on file as of 03/27/2024.    Allergies as of 03/27/2024 - Reviewed 03/27/2024  Allergen Reaction Noted  . Corticosteroids (glucocorticoids) Hallucination and Other (See Comments) 04/10/2014  . Grass pollen-june grass standard Itching 12/31/2020  . Grass pollen-red top, standard Itching 12/31/2020    Past Medical History:  Diagnosis Date  . Anemia   . Arthritis   . Diabetes mellitus type 2, uncomplicated (CMS/HHS-HCC)   . Hyperlipidemia associated with type 1 diabetes mellitus  (CMS-HCC) 08/19/2019  . Hypertension   . Major depressive disorder, recurrent, in remission () 01/28/2021  . Multiple sclerosis (CMS/HHS-HCC)     Past Surgical History:  Procedure Laterality Date  . LAPAROSCOPIC TUBAL LIGATION  2013  . DILATION & CURRETTAGE  1986, 1987    Vitals:   03/27/24 1331  BP: 122/68  Pulse: 79   Body mass index is 25.31 kg/m.  Appointment on 03/20/2024  Component Date Value Ref Range Status  . WBC (White Blood Cell Count) 03/20/2024 4.1  4.1 - 10.2 10^3/uL Final  . RBC (Red Blood Cell Count) 03/20/2024 4.55  4.04 - 5.48 10^6/uL Final  . Hemoglobin 03/20/2024 12.7  12.0 - 15.0 gm/dL Final  . Hematocrit 94/77/7974 40.0  35.0 - 47.0 % Final  . MCV (Mean Corpuscular Volume) 03/20/2024 87.9  80.0 - 100.0 fl Final  . MCH (Mean Corpuscular Hemoglobin) 03/20/2024 27.9  27.0 - 31.2 pg Final  . MCHC (Mean Corpuscular Hemoglobin * 03/20/2024 31.8 (L)  32.0 - 36.0 gm/dL Final  . Platelet Count 03/20/2024 275  150 - 450 10^3/uL Final  . RDW-CV (Red Cell Distribution Widt* 03/20/2024 13.4  11.6 - 14.8 % Final  . MPV (Mean Platelet Volume) 03/20/2024 10.2  9.4 - 12.4 fl Final  . Neutrophils 03/20/2024 1.51  1.50 - 7.80 10^3/uL Final  . Lymphocytes 03/20/2024 1.95  1.00 - 3.60 10^3/uL Final  . Monocytes 03/20/2024 0.43  0.00 - 1.50 10^3/uL Final  . Eosinophils 03/20/2024 0.14  0.00 - 0.55 10^3/uL Final  . Basophils 03/20/2024 0.07  0.00 - 0.09 10^3/uL Final  . Neutrophil % 03/20/2024 36.8  32.0 - 70.0 % Final  . Lymphocyte % 03/20/2024 47.6  10.0 - 50.0 % Final  . Monocyte % 03/20/2024 10.5  4.0 - 13.0 % Final  . Eosinophil % 03/20/2024 3.4  1.0 - 5.0 % Final  . Basophil% 03/20/2024 1.7  0.0 - 2.0 % Final  . Immature Granulocyte % 03/20/2024 0.0  <=0.7 % Final  . Immature Granulocyte Count 03/20/2024 0.00  <=0.06 10^3/L Final  . Glucose 03/20/2024 110  70 - 110 mg/dL Final  . Sodium 94/77/7974 141  136 - 145 mmol/L Final  . Potassium 03/20/2024 4.2  3.6 - 5.1  mmol/L Final  . Chloride 03/20/2024 104  97 - 109 mmol/L Final  . Carbon Dioxide (CO2) 03/20/2024 31.6  22.0 - 32.0 mmol/L Final  . Urea Nitrogen (BUN) 03/20/2024 17  7 - 25 mg/dL Final  . Creatinine 94/77/7974 0.9  0.6 - 1.1 mg/dL Final  . Glomerular Filtration Rate (eGFR) 03/20/2024 74  >60 mL/min/1.73sq m Final   CKD-EPI (2021) does not include patient's race in the calculation of eGFR.  Monitoring changes of plasma creatinine and  eGFR over time is useful for monitoring kidney function.   Interpretive Ranges for eGFR (CKD-EPI 2021):  eGFR:       >60 mL/min/1.73 sq. m - Normal eGFR:       30-59 mL/min/1.73 sq. m - Moderately Decreased eGFR:       15-29 mL/min/1.73 sq. m  - Severely Decreased eGFR:       < 15 mL/min/1.73 sq. m  - Kidney Failure    Note: These eGFR calculations do not apply in acute situations when eGFR is changing rapidly or patients on dialysis.  . Calcium 03/20/2024 9.5  8.7 - 10.3 mg/dL Final  . AST  94/77/7974 15  8 - 39 U/L Final  . ALT  03/20/2024 15  5 - 38 U/L Final  . Alk Phos (alkaline Phosphatase) 03/20/2024 63  34 - 104 U/L Final  . Albumin 03/20/2024 4.1  3.5 - 4.8 g/dL Final  . Bilirubin, Total 03/20/2024 0.4  0.3 - 1.2 mg/dL Final  . Protein, Total 03/20/2024 6.5  6.1 - 7.9 g/dL Final  . A/G Ratio 94/77/7974 1.7  1.0 - 5.0 gm/dL Final  . Hemoglobin J8R 03/20/2024 7.8 (H)  4.2 - 5.6 % Final  . Average Blood Glucose (Calc) 03/20/2024 177  mg/dL Final  . Cholesterol, Total 03/20/2024 179  100 - 200 mg/dL Final  . Triglyceride 94/77/7974 63  35 - 199 mg/dL Final  . HDL (High Density Lipoprotein) Cho* 03/20/2024 66.8  35.0 - 85.0 mg/dL Final  . LDL Calculated 03/20/2024 899  0 - 130 mg/dL Final  . VLDL Cholesterol 03/20/2024 13  mg/dL Final  . Cholesterol/HDL Ratio 03/20/2024 2.7   Final  . Thyroid Stimulating Hormone (TSH) 03/20/2024 2.546  0.450-5.330 uIU/ml uIU/mL Final   Reference Range for Pregnant Females >= 18 yrs old: Normal Range for 1st  trimester: 0.05-3.70 ulU/ml Normal Range for 2nd trimester: 0.31-4.35 ulU/ml    Exam  Wt Readings from Last 3 Encounters:  03/27/24 77.7 kg (171 lb 6.4 oz)  02/13/24 76.6 kg (168 lb 14 oz)  11/20/23 84.8 kg (187 lb)  Blood pressure 122/68, pulse 79, height 175.3 cm (5' 9), weight 77.7 kg (171 lb 6.4 oz), SpO2 98%.  Wt Readings from Last 3 Encounters:  03/27/24 77.7 kg (171 lb 6.4 oz)  02/13/24 76.6 kg (168 lb 14 oz)  11/20/23 84.8 kg (187 lb)    Body mass index is 25.31 kg/m.  General: Alert oriented x3  NAD Skin: No suspicious lesions or moles.   Eyes: Sclera and conjunctiva clear; pupils equal round and reactive to light  extraocular movements intact Ears: External ears and canal normal; tympanic membranes normal.   Nose: Mucosa healthy without drainage or ulceration Oropharynx: No suspicious lesions Neck: No swelling, masses, stiffness, pain, limited movement, carotid pulses normal bilaterally, thyroid normal size, no masses palpated. No bruits heard. Lungs: Respirations unlabored; clear to auscultation bilaterally Back: No spinal deformity Cardiovascular: Heart regular rate and rhythm without murmurs, gallops, or rubs Abdomen: Soft; non tender; non distended;  no masses or organomegaly PELVIC: Declined  Lymph Nodes: No significant cervical or supraclavicular lymphadenopathy noted Extremities: Normal, no edema Tenderness + Rt hip and Rt low back  Neurologic: Alert and oriented; speech intact; face symmetrical; moves all extremities well     Assessment and Plan:  1Type 1 DM;/ Recent Weight gain  On Omni pod insulin pump -Humalog as per sliding scale tid (10-13 units) and Mounjaro  Last A1c: 7,7 Increase Mounjaro to 5 mg s/q  q weekly  Discussed need for better control Sees Endocrine- Dr. Clorinda Lipps  Monitors sugars with CGM - Dexcom  Discussed diet and exercise  Recommend Annual eye exam - [facility] in [place]  2 Pain in low back / Spinal stenosis-  Sees Dr. Katrina and is being considered for back srgery  On Meloxicam- prescription sent  3 Work related stress / Anxiety: Doing better after she changed jobs- On  Lexapro 10 mg po qd Prescription sent for Lexapro 4 HTN: On Valsartan/ HCT Monitor at home  5 MS; On Tecfidera 240 mg po twice a day - sees Dr.Morgenlender at [facility]  Declines meds 6 Anemia/ Low B12; Hgb has improved  - Monitor  7 Hypercholesterolemia; On Crestor  8 Health Maintenance: Declined Flu shot or Pneumovax.SABRA Up to date with PAP- sees GYN in [place] Declines Colonoscopy -Cologuard Nov 2023-Negative  Advised to quit smoking completely Discussed results of labs  Mammogram - Oct 2024- Check cbc, met-c, lipids, ua, A1c  1 week prior to next visit  Follow up in 4 months    Tamra Leventhal  MD

## 2024-06-09 ENCOUNTER — Encounter (HOSPITAL_COMMUNITY): Payer: Self-pay

## 2024-06-09 ENCOUNTER — Other Ambulatory Visit: Payer: Self-pay

## 2024-06-09 ENCOUNTER — Emergency Department (HOSPITAL_COMMUNITY)

## 2024-06-09 ENCOUNTER — Emergency Department (HOSPITAL_COMMUNITY)
Admission: EM | Admit: 2024-06-09 | Discharge: 2024-06-09 | Disposition: A | Discharge status: Home / Self Care | Attending: Emergency Medicine | Admitting: Emergency Medicine

## 2024-06-09 DIAGNOSIS — E109 Type 1 diabetes mellitus without complications: Secondary | ICD-10-CM | POA: Diagnosis not present

## 2024-06-09 DIAGNOSIS — R079 Chest pain, unspecified: Secondary | ICD-10-CM | POA: Insufficient documentation

## 2024-06-09 DIAGNOSIS — Z79899 Other long term (current) drug therapy: Secondary | ICD-10-CM | POA: Diagnosis not present

## 2024-06-09 DIAGNOSIS — I10 Essential (primary) hypertension: Secondary | ICD-10-CM | POA: Diagnosis not present

## 2024-06-09 DIAGNOSIS — Z794 Long term (current) use of insulin: Secondary | ICD-10-CM | POA: Diagnosis not present

## 2024-06-09 LAB — CBC WITH DIFFERENTIAL/PLATELET
Abs Immature Granulocytes: 0.01 K/uL (ref 0.00–0.07)
Basophils Absolute: 0.1 K/uL (ref 0.0–0.1)
Basophils Relative: 1 %
Eosinophils Absolute: 0.1 K/uL (ref 0.0–0.5)
Eosinophils Relative: 2 %
HCT: 37.2 % (ref 36.0–46.0)
Hemoglobin: 12.1 g/dL (ref 12.0–15.0)
Immature Granulocytes: 0 %
Lymphocytes Relative: 27 %
Lymphs Abs: 1.8 K/uL (ref 0.7–4.0)
MCH: 28.3 pg (ref 26.0–34.0)
MCHC: 32.5 g/dL (ref 30.0–36.0)
MCV: 86.9 fL (ref 80.0–100.0)
Monocytes Absolute: 0.6 K/uL (ref 0.1–1.0)
Monocytes Relative: 9 %
Neutro Abs: 4.1 K/uL (ref 1.7–7.7)
Neutrophils Relative %: 61 %
Platelets: 218 K/uL (ref 150–400)
RBC: 4.28 MIL/uL (ref 3.87–5.11)
RDW: 13.2 % (ref 11.5–15.5)
WBC: 6.7 K/uL (ref 4.0–10.5)
nRBC: 0 % (ref 0.0–0.2)

## 2024-06-09 LAB — COMPREHENSIVE METABOLIC PANEL WITH GFR
ALT: 15 U/L (ref 0–44)
AST: 15 U/L (ref 15–41)
Albumin: 3.5 g/dL (ref 3.5–5.0)
Alkaline Phosphatase: 50 U/L (ref 38–126)
Anion gap: 7 (ref 5–15)
BUN: 9 mg/dL (ref 6–20)
CO2: 26 mmol/L (ref 22–32)
Calcium: 9.8 mg/dL (ref 8.9–10.3)
Chloride: 104 mmol/L (ref 98–111)
Creatinine, Ser: 0.69 mg/dL (ref 0.44–1.00)
GFR, Estimated: >60 mL/min (ref >60)
Glucose, Bld: 192 mg/dL — ABNORMAL HIGH (ref 70–99)
Potassium: 3.9 mmol/L (ref 3.5–5.1)
Sodium: 137 mmol/L (ref 135–145)
Total Bilirubin: 0.6 mg/dL (ref 0.0–1.2)
Total Protein: 6.3 g/dL — ABNORMAL LOW (ref 6.5–8.1)

## 2024-06-09 LAB — TROPONIN I (HIGH SENSITIVITY)
Troponin I (High Sensitivity): 5 ng/L (ref <18)
Troponin I (High Sensitivity): 5 ng/L (ref <18)

## 2024-06-09 LAB — LIPASE, BLOOD: Lipase: 22 U/L (ref 11–51)

## 2024-06-09 LAB — MAGNESIUM: Magnesium: 1.8 mg/dL (ref 1.7–2.4)

## 2024-06-09 MED ORDER — IOHEXOL 350 MG/ML SOLN
75.0000 mL | Freq: Once | INTRAVENOUS | Status: AC | PRN
  Administered 2024-06-09 (×2): 75 mL via INTRAVENOUS

## 2024-06-09 MED ORDER — ALUM & MAG HYDROXIDE-SIMETH 200-200-20 MG/5ML PO SUSP
30.0000 mL | Freq: Once | ORAL | Status: AC
  Administered 2024-06-09 (×2): 30 mL via ORAL
  Filled 2024-06-09: qty 30

## 2024-06-09 MED ORDER — LIDOCAINE VISCOUS HCL 2 % MT SOLN
15.0000 mL | Freq: Once | OROMUCOSAL | Status: AC
  Administered 2024-06-09 (×2): 15 mL via ORAL
  Filled 2024-06-09: qty 15

## 2024-06-09 MED ORDER — ASPIRIN 81 MG PO CHEW: 324.0000 mg | CHEWABLE_TABLET | Freq: Once | ORAL | Status: DC

## 2024-06-09 NOTE — ED Triage Notes (Signed)
 Patient arrived by EMS with complaint of chest pain since early am. Reports that it continued throughout the day at work and called ems. Arrived alert and oriented and reports some relief with 1 sl ntg. Patient diabetic and CBG pta [phone]

## 2024-06-09 NOTE — ED Notes (Signed)
 Pt to and from CT

## 2024-06-09 NOTE — Discharge Instructions (Signed)
 Thank you for allowing us  to care for you today.  Your chest pain workup was negative.  There was no evidence of dissection on your CT scan.  We are giving you a referral to a cardiologist for you to follow-up in the outpatient setting.  Please be sure to also follow-up with your primary care provider.  Please return to the emergency department if you experience worsening pain, worsening chest pain, shortness of breath, passout or believe you need emergent medical care

## 2024-06-09 NOTE — ED Provider Notes (Signed)
 Supervised resident visit.  Patient here with chest pain upper abdominal pain.  Felt like reflux type symptoms all day today.  She took some over-the-counter medications for this without much relief.  Denies any recent surgery or travel.  She is Wells criteria 0 doubt PE.  Differential diagnosis includes pancreatitis dissection ACS gastritis less likely cholecystitis bowel obstruction.  She has a history of diabetes MS hypertension high cholesterol.  Overall we will get CBC CMP lipase troponin CT dissection study.  Overall lab work thus far unremarkable.  No significant leukocytosis anemia or electrolyte abnormality.  Troponin negative x 2.  EKG shows sinus rhythm.  No ischemic changes.  Chest x-ray shows no acute findings.  Overall awaiting CT dissection study.  If unremarkable anticipate discharge to home.  Otherwise we will address any acute findings.  Will likely need reflux medications for home if unremarkable is likely gastritis.  Please see my resident note for further results evaluation and disposition of the patient.  This chart was dictated using voice recognition software.  Despite best efforts to proofread,  errors can occur which can change the documentation meaning.    Ruthe Cornet, DO 06/09/24 2003

## 2024-06-09 NOTE — ED Notes (Signed)
 Pt upset with wait times. Apologies made. Deciding whether or not she wants to stay. Encouraged to stay for results.

## 2024-06-09 NOTE — ED Provider Notes (Addendum)
 [place] EMERGENCY DEPARTMENT AT [facility] Provider Note   CSN: [id number] Arrival date & time: 06/09/24  1611     Patient presents with: No chief complaint on file.   Meghan Callahan is a 58 y.o. female past medical history significant for multiple sclerosis, type 1 diabetes, hypertension, hyperlipidemia, MDD who presents emergency department for chest pain.  Patient states that she has a history of GERD which flares with leafy greens.  Patient states that she was eating collard greens last night and began to experience epigastric abdominal pain and chest pain that she thought would be relieved after PPI and Tums however continued to be present.  Patient states that her chest pain is substernal with radiation to the back.  Patient states that her back pain is very sharp in nature.  Patient denies associated nausea, vomiting, shortness of breath.  Patient denies associated fever.   HPI     Prior to Admission medications   Medication Sig Start Date End Date Taking? Authorizing Provider  aluminum chloride (DRYSOL) 20 % external solution Apply topically 2 (two) times daily.    [provider]  Biotin 1000 MCG tablet Take 1,000 mcg by mouth daily. 04/29/12   [provider]  Cholecalciferol (D 1000) 25 MCG (1000 UT) capsule Take 1,000 Units by mouth daily. 06/04/15   [provider]  Continuous Glucose Receiver (DEXCOM G6 RECEIVER) DEVI Use to monitor blood sugar. Patient not taking: Reported on 10/02/2023 08/19/19   [provider]  Continuous Glucose Sensor (DEXCOM G7 SENSOR) MISC CHANGE SENSOR EVERY 10 DAYS    [provider]  Continuous Glucose Transmitter (DEXCOM G6 TRANSMITTER) MISC USE 1 EVERY 3 MONTHS AS DIRECTED Patient not taking: Reported on 10/02/2023    [provider]  Dimethyl Fumarate 240 MG CPDR Take by mouth.    [provider]  escitalopram (LEXAPRO) 10 MG tablet Take 10 mg by mouth daily.  07/23/23   [provider]  etodolac  (LODINE ) 400 MG tablet Take 1 tablet (400 mg total) by mouth 2 (two) times daily. 07/17/23   Saunders Shona CROME, PA-C  ferrous sulfate 325 (65 FE) MG tablet Take by mouth.    [provider]  gabapentin (NEURONTIN) 100 MG capsule Take 100 mg by mouth at bedtime as needed.    [provider]  gabapentin (NEURONTIN) 300 MG capsule Take 600 mg by mouth at bedtime.    [provider]  hydrochlorothiazide  (HYDRODIURIL ) 25 MG tablet Take 1 tablet (25 mg total) by mouth daily. 01/31/18   Edelmiro, [place], MD  insulin lispro (HUMALOG) 100 UNIT/ML injection Inject into the skin once.    [provider]  irbesartan-hydrochlorothiazide  (AVALIDE) 150-12.5 MG tablet Take 1 tablet by mouth daily.    [provider]  ketorolac (TORADOL) 10 MG tablet Take 10 mg by mouth every 6 (six) hours as needed. 08/06/23   [provider]  lidocaine  (LIDODERM ) 5 % Place 1 patch onto the skin every 12 (twelve) hours. Remove & Discard patch within 12 hours or as directed by MD 07/17/23 07/16/24  Saunders Shona L, PA-C  magnesium oxide (MAG-OX) 400 MG tablet Take 400 mg by mouth daily.    [provider]  meloxicam (MOBIC) 15 MG tablet Take 15 mg by mouth daily. 01/09/23   [provider]  nicotine (NICODERM CQ - DOSED IN MG/24 HOURS) 21 mg/24hr patch Place 21 mg onto the skin daily.    [provider]  rosuvastatin (CRESTOR)  10 MG tablet Take 10 mg by mouth daily. 07/11/23   [provider]  Sodium Caprylate POWD Take by mouth.    [provider]  valACYclovir (VALTREX) 500 MG tablet Take 500 mg by mouth 2 (two) times daily.    [provider]  valsartan-hydrochlorothiazide  (DIOVAN-HCT) 160-12.5 MG tablet Take 1 tablet by mouth daily.    [provider]    Allergies: Corticosteroids and Gramineae pollens    Review of Systems  Updated Vital Signs BP (!) 181/100 (BP  Location: Right Arm)   Pulse 84   Temp 98.3 F (36.8 C) (Oral)   Resp 18   LMP 03/08/2018   SpO2 100%   Physical Exam Vitals reviewed.  Constitutional:      General: She is not in acute distress.    Appearance: She is not ill-appearing.  HENT:     Head: Normocephalic and atraumatic.  Eyes:     General: No visual field deficit.    Pupils: Pupils are equal, round, and reactive to light.  Neck:     Vascular: No JVD.     Trachea: Trachea normal.  Cardiovascular:     Rate and Rhythm: Normal rate and regular rhythm.     Pulses:          Radial pulses are 2+ on the right side and 2+ on the left side.       Dorsalis pedis pulses are 2+ on the right side and 2+ on the left side.     Heart sounds: Normal heart sounds. No murmur heard. Chest:     Comments: No tenderness to palpation Abdominal:     General: There is no distension.     Palpations: Abdomen is soft.     Tenderness: There is no abdominal tenderness.  Musculoskeletal:     Cervical back: Full passive range of motion without pain and normal range of motion.     Right lower leg: No edema.     Left lower leg: No edema.     Comments: Spontaneous movement of bilateral upper and lower extremities  Neurological:     General: No focal deficit present.     Mental Status: She is alert and oriented to person, place, and time.     Cranial Nerves: No cranial nerve deficit, dysarthria or facial asymmetry.     Sensory: Sensation is intact. No sensory deficit.     Motor: Motor function is intact. No weakness, tremor, seizure activity or pronator drift.     Coordination: Coordination is intact. Finger-Nose-Finger Test normal.  Psychiatric:        Behavior: Behavior is cooperative.     (all labs ordered are listed, but only abnormal results are displayed) Labs Reviewed  COMPREHENSIVE METABOLIC PANEL WITH GFR - Abnormal; Notable for the following components:      Result Value   Glucose, Bld 192 (*)    Total Protein 6.3 (*)    All  other components within normal limits  CBC WITH DIFFERENTIAL/PLATELET  MAGNESIUM  LIPASE, BLOOD  TROPONIN I (HIGH SENSITIVITY)  TROPONIN I (HIGH SENSITIVITY)    EKG: EKG Interpretation Date/Time:  Monday June 09 2024 16:23:31 EDT Ventricular Rate:  77 PR Interval:  163 QRS Duration:  70 QT Interval:  369 QTC Calculation: 418 R Axis:   51  Text Interpretation: Sinus rhythm Confirmed by Ruthe Cornet [phone]) on 06/09/2024 4:41:04 PM  Radiology: CT Angio Chest/Abd/Pel for Dissection W and/or Wo Contrast Result Date: 06/09/2024 CLINICAL DATA:  Chest  and back pain, hypertension, concern for dissection EXAM: CT ANGIOGRAPHY CHEST, ABDOMEN AND PELVIS TECHNIQUE: Non-contrast CT of the chest was initially obtained. Multidetector CT imaging through the chest, abdomen and pelvis was performed using the standard protocol during bolus administration of intravenous contrast. Multiplanar reconstructed images and MIPs were obtained and reviewed to evaluate the vascular anatomy. RADIATION DOSE REDUCTION: This exam was performed according to the departmental dose-optimization program which includes automated exposure control, adjustment of the mA and/or kV according to patient size and/or use of iterative reconstruction technique. CONTRAST:  75mL OMNIPAQUE  IOHEXOL  350 MG/ML SOLN COMPARISON:  None Available. FINDINGS: CTA CHEST FINDINGS VASCULAR Aorta: Satisfactory opacification of the aorta. Normal contour and caliber of the thoracic aorta. No evidence of aneurysm, dissection, or other acute aortic pathology. Moderate mixed calcific atherosclerosis. Cardiovascular: No evidence of pulmonary embolism on limited non-tailored examination. Normal heart size. Three-vessel coronary artery calcifications. No pericardial effusion. Review of the MIP images confirms the above findings. NON VASCULAR Mediastinum/Nodes: No enlarged mediastinal, hilar, or axillary lymph nodes. Thyroid gland, trachea, and esophagus demonstrate  no significant findings. Lungs/Pleura: Lungs are clear. Mild tubular bronchiectasis. No pleural effusion or pneumothorax. Musculoskeletal: No chest wall abnormality. No acute osseous findings. Review of the MIP images confirms the above findings. CTA ABDOMEN AND PELVIS FINDINGS VASCULAR Normal contour and caliber of the abdominal aorta. No evidence of aneurysm, dissection, or other acute aortic pathology. Standard branching pattern of the abdominal aorta with solitary bilateral renal arteries. Severe aortic atherosclerosis. Review of the MIP images confirms the above findings. NON-VASCULAR Hepatobiliary: No solid liver abnormality is seen. No gallstones, gallbladder wall thickening, or biliary dilatation. Pancreas: Unremarkable. No pancreatic ductal dilatation or surrounding inflammatory changes. Spleen: Normal in size without significant abnormality. Adrenals/Urinary Tract: Adrenal glands are unremarkable. Kidneys are normal, without renal calculi, solid lesion, or hydronephrosis. Bladder is unremarkable. Stomach/Bowel: Stomach is within normal limits. Appendix appears normal. No evidence of bowel wall thickening, distention, or inflammatory changes. Moderate burden of stool. Lymphatic: No enlarged abdominal or pelvic lymph nodes. Reproductive: Calcified uterine fibroids. Other: No abdominal wall hernia or abnormality. No ascites. Musculoskeletal: No acute osseous findings. IMPRESSION: 1. Normal contour and caliber of the thoracic and abdominal aorta. No evidence of aneurysm, dissection, or other acute aortic pathology. Severe aortic atherosclerosis. 2. Coronary artery disease. 3. Mild tubular bronchiectasis. 4. Calcified uterine fibroids. Aortic Atherosclerosis (ICD10-I70.0). Electronically Signed   By: Marolyn JONETTA Jaksch M.D.   On: 06/09/2024 20:32   DG Chest Portable 1 View Result Date: 06/09/2024 EXAM: 1 VIEW XRAY OF THE CHEST 06/09/2024 04:51:00 PM COMPARISON: 01/31/2018 CLINICAL HISTORY: Chest pain. Per triage  notes: Patient arrived by EMS with complaint of chest pain since early am. Reports that it continued throughout the day at work and called ems. Arrived alert and oriented and reports some relief with 1 sl ntg. Patient diabetic and CBG pta ; 245 FINDINGS: LUNGS AND PLEURA: Lower lung predominant pulmonary interstitial thickening, likely related to clinical history of smoking. No lobar consolidation. No pleural effusion. No pneumothorax. HEART AND MEDIASTINUM: Borderline cardiomegaly. Atherosclerosis in the transverse aorta. BONES AND SOFT TISSUES: Multiple wires and leads project over the chest on the frontal radiograph. No acute osseous abnormality. IMPRESSION: 1. No acute findings. 2. Borderline cardiomegaly. 3. Lower lung predominant pulmonary interstitial thickening, likely related to clinical history of smoking. Electronically signed by: Rockey Kilts MD 06/09/2024 06:00 PM EDT RP Workstation: [id number]     Procedures   Medications Ordered in the ED  aspirin  chewable tablet 324  mg (324 mg Oral Not Given 06/09/24 1709)  alum & mag hydroxide-simeth (MAALOX/MYLANTA) 200-200-20 MG/5ML suspension 30 mL (30 mLs Oral Given 06/09/24 1703)    And  lidocaine  (XYLOCAINE ) 2 % viscous mouth solution 15 mL (15 mLs Oral Given 06/09/24 1703)  iohexol  (OMNIPAQUE ) 350 MG/ML injection 75 mL (75 mLs Intravenous Contrast Given 06/09/24 1835)    Clinical Course as of 06/09/24 2038  Mon Jun 09, 2024  1627 EKG reviewed by me: Normal sinus rhythm with normal axis and intervals.  No evidence of acute ischemic change, heart block or dysrhythmia [AG]  1710 Chest x-ray reviewed by me: Trachea is midline.  No cardiomegaly.  No pleural effusion.  No pneumothorax.  No evidence of focal consolidation. [AG]  2007 Patient requested to be discharged.  Explained in detail that her CT dissection study has not been read by radiologist at this time and would recommend not leaving the hospital prior to completion of evaluation.  Patient  understands the risks of leaving including death.  Patient will leave AMA [AG]  2023 CT Angio Chest/Abd/Pel for Dissection W and/or Wo Contrast [AG]    Clinical Course User Index [AG] Nada Chroman, DO                                 Medical Decision Making Amount and/or Complexity of Data Reviewed Labs: ordered. Radiology: ordered. Decision-making details documented in ED Course.  Risk OTC drugs. Prescription drug management.   On initial valuation patient is hypertensive however afebrile and saturating well on room air.  Based upon patient's history and physical examination findings differential diagnosis includes ACS/MI, aortic dissection, myocarditis/pericarditis, pneumonia, pneumothorax, gastritis, pulmonary embolism.  Will obtain laboratory studies as well as CT dissection study.  Patient's presentation less likely pulmonary embolism as patient has Wells 0 therefore do not believe CTA PE study is necessary at this time however will be able to see large saddle embolus on CT dissection study if present.  Patient's presentation less likely myocarditis/pericarditis as patient does not have positional chest pain, is afebrile without leukocytosis and troponin within normal limits without significant findings on EKG.  ACS/MI less likely as patient has no acute ischemic findings on EKG and troponin within normal limits.  Chest x-ray without evidence of pneumonia, pneumothorax or other cardiopulmonary abnormality.  CTA dissection study without evidence of acute dissection or other acute abnormality.  At this time believe patient is safe to be discharged home however will give cardiology referral at discharge.  Patient was given strict return precautions and agreed with understood plan of care at time of discharge     Final diagnoses:  Chest pain, unspecified type    ED Discharge Orders          Ordered    Ambulatory referral to Cardiology       Comments: If you have not heard from the  Cardiology office within the next 72 hours please call [phone].   06/09/24 2036            Chroman Nada DO Emergency Medicine PGY2     Ruthe Cornet, DO 06/09/24 2032    Nada Chroman, DO 06/09/24 2038    Ruthe Cornet, DO 06/09/24 2052

## 2024-10-10 ENCOUNTER — Other Ambulatory Visit: Payer: Self-pay | Admitting: Internal Medicine

## 2024-10-10 DIAGNOSIS — Z1231 Encounter for screening mammogram for malignant neoplasm of breast: Secondary | ICD-10-CM
